# Patient Record
Sex: Female | Born: 1950
Health system: Southern US, Community
[De-identification: ages and names within clinical notes are randomized; demographics above are authoritative.]

## PROBLEM LIST (undated history)

## (undated) DIAGNOSIS — J45909 Unspecified asthma, uncomplicated: Secondary | ICD-10-CM

## (undated) DIAGNOSIS — K859 Acute pancreatitis without necrosis or infection, unspecified: Secondary | ICD-10-CM

## (undated) DIAGNOSIS — S32000A Wedge compression fracture of unspecified lumbar vertebra, initial encounter for closed fracture: Secondary | ICD-10-CM

## (undated) DIAGNOSIS — E7800 Pure hypercholesterolemia, unspecified: Secondary | ICD-10-CM

## (undated) DIAGNOSIS — K219 Gastro-esophageal reflux disease without esophagitis: Secondary | ICD-10-CM

## (undated) DIAGNOSIS — Z9889 Other specified postprocedural states: Secondary | ICD-10-CM

## (undated) DIAGNOSIS — I10 Essential (primary) hypertension: Secondary | ICD-10-CM

## (undated) DIAGNOSIS — M810 Age-related osteoporosis without current pathological fracture: Secondary | ICD-10-CM

## (undated) DIAGNOSIS — E119 Type 2 diabetes mellitus without complications: Secondary | ICD-10-CM

## (undated) DIAGNOSIS — K635 Polyp of colon: Secondary | ICD-10-CM

## (undated) DIAGNOSIS — I719 Aortic aneurysm of unspecified site, without rupture: Secondary | ICD-10-CM

## (undated) HISTORY — DX: Aortic aneurysm of unspecified site, without rupture: I71.9

## (undated) HISTORY — DX: Gastro-esophageal reflux disease without esophagitis: K21.9

## (undated) HISTORY — DX: Essential (primary) hypertension: I10

## (undated) HISTORY — DX: Pure hypercholesterolemia, unspecified: E78.00

## (undated) HISTORY — DX: Unspecified asthma, uncomplicated: J45.909

## (undated) HISTORY — DX: Type 2 diabetes mellitus without complications: E11.9

## (undated) HISTORY — DX: Age-related osteoporosis without current pathological fracture: M81.0

## (undated) HISTORY — DX: Wedge compression fracture of unspecified lumbar vertebra, initial encounter for closed fracture: S32.000A

## (undated) HISTORY — DX: Polyp of colon: K63.5

## (undated) HISTORY — DX: Acute pancreatitis without necrosis or infection, unspecified: K85.90

---

## 1975-04-06 HISTORY — PX: TUBAL LIGATION: SHX77

## 2002-04-05 HISTORY — PX: BACK SURGERY: SHX140

## 2004-04-27 ENCOUNTER — Ambulatory Visit (HOSPITAL_COMMUNITY): Admission: RE | Admit: 2004-04-27 | Discharge: 2004-04-28 | Payer: Self-pay | Admitting: Neurological Surgery

## 2004-11-22 ENCOUNTER — Encounter: Admission: RE | Admit: 2004-11-22 | Discharge: 2004-11-22 | Payer: Self-pay | Admitting: Neurological Surgery

## 2004-12-25 ENCOUNTER — Ambulatory Visit (HOSPITAL_COMMUNITY): Admission: AD | Admit: 2004-12-25 | Discharge: 2004-12-28 | Payer: Self-pay | Admitting: Neurological Surgery

## 2006-12-28 ENCOUNTER — Observation Stay (HOSPITAL_COMMUNITY): Admission: EM | Admit: 2006-12-28 | Discharge: 2006-12-29 | Payer: Self-pay | Admitting: Emergency Medicine

## 2006-12-28 ENCOUNTER — Encounter (INDEPENDENT_AMBULATORY_CARE_PROVIDER_SITE_OTHER): Payer: Self-pay | Admitting: Emergency Medicine

## 2006-12-28 ENCOUNTER — Ambulatory Visit: Payer: Self-pay | Admitting: Family Medicine

## 2009-01-14 IMAGING — CR DG CHEST 1V PORT
1 series · 1 of 1 positions shown · non-contrast
Comparison: 04/27/2004

CLINICAL DATA: Chest pain.  
 PORTABLE CHEST - 12/27/2006:

[view not recorded]
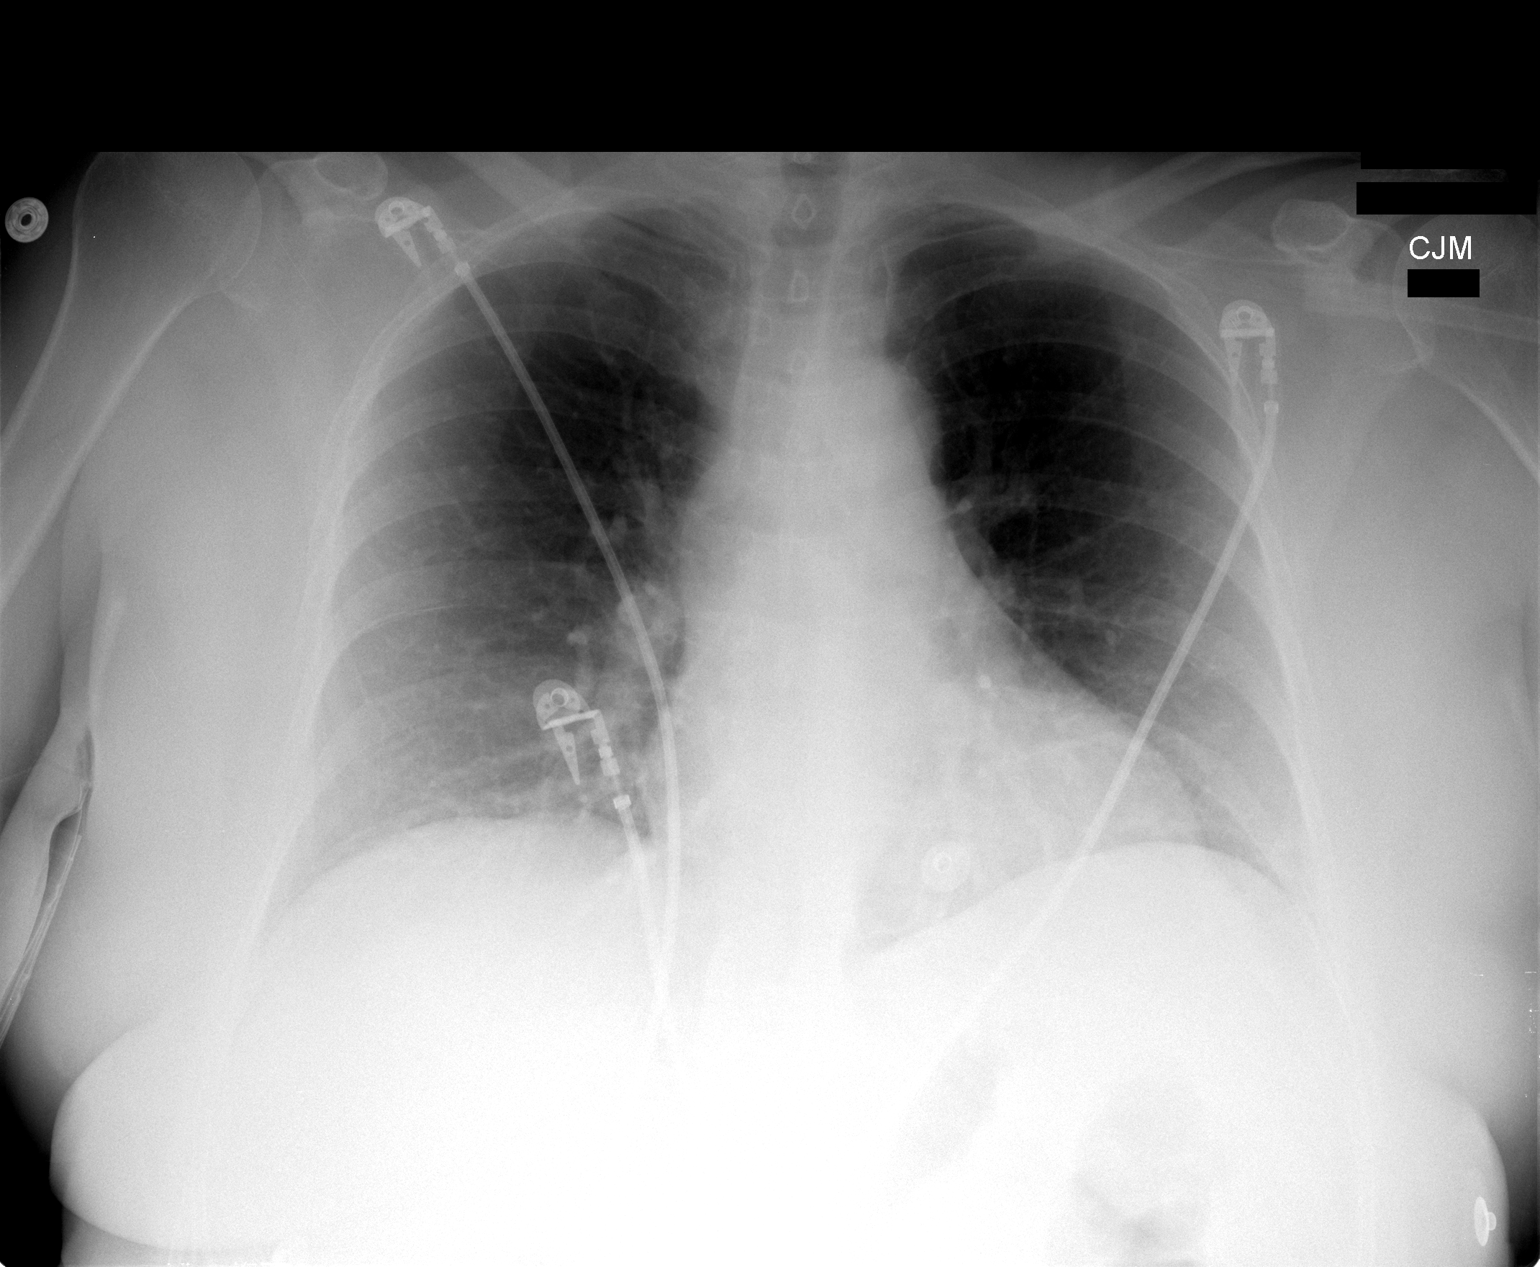

[1 of 1 positions shown; findings below may reference images not displayed]

FINDINGS: Lung volumes are low but the lungs are clear.  Heart size is normal.  No effusion or focal bony abnormality.
IMPRESSION: No acute disease.

## 2010-08-18 NOTE — H&P (Signed)
[name], [name] NO.:  [id number]   MEDICAL RECORD NO.:  [id number]          PATIENT TYPE:  INP   LOCATION:  3737                         FACILITY:  [facility]   PHYSICIAN:  Nicole Price, M.D.DATE OF BIRTH:  1950-06-25   DATE OF ADMISSION:  12/27/2006  DATE OF DISCHARGE:                              HISTORY & PHYSICAL   CHIEF COMPLAINT:  Chest pain, rule out myocardial infarction.   HISTORY OF PRESENT ILLNESS:  This is a 60 year old female with no  significant past medical history, who presents to the ED via EMS, with a  complaint of chest pain.  The patient states that the chest pain started  at approximately 7:45 p.m. on the evening of September 23, while  watching her grandson play baseball.  She said that the pain was  sharp, and points to the sternal area, and states that it radiated  down and around the left chest wall.  The patient reports that the pain  is episodic, stating that it lasted about 1 minute, with spontaneous  resolution, then would return after another minute or so.  This  continued until the patient received aspirin and nitroglycerin per EMS,  with resolution of her symptoms.  The patient reports that the symptoms  have not returned when we saw her in the ED.  The patient also reports  left arm heaviness and numbness after the chest pain started earlier in  the evening.  She denies any pain the arm, however.  Also, denies any  pain radiating to the jaw, denies any diaphoresis, denies nausea and  vomiting as well.  The patient denies any recent chest wall trauma, new  physical activity, or any increased stress in her life at this time.  The patient denies any episodes of chest pain prior to this present  episode, denies shortness of breath.  The patient is a nonsmoker.  The  patient's father died of an early myocardial infarction at approximately  age 45.   The patient's primary care Kelicia Youtz is Dr. Maisie Fus in [place] with [facility]  [address] [facility].   ALLERGIES:  No known drug allergies.   MEDICATIONS:  None.  The patient takes no vitamins or supplements.   PAST MEDICAL HISTORY:  The patient has had 2 back surgeries for  herniated disk, this was in 2004 with repeat procedure in 2006.   SOCIAL HISTORY:  The patient lives with husband.  The patient works in  Conservator, museum/gallery, Administrator, Civil Service.  The patient has 2 children,  ages 31 and 68, both songs.  The patient denies any alcohol, smoking or  drugs.  The patient states that her health care decision-maker is her  husband, if the patient becomes unable to speak for herself.  At this  time, the patient states that she does not want to be intubated, but is  otherwise a full code.   FAMILY HISTORY:  The patient's father died of myocardial infarction at  age 67 with a possible prior myocardial infarction prior to this.  The  patient also reports that her father had lung cancer with surgery  to  remove the cancer at some point as well.  The patient also reports  hypertension in her father.  The patient also reports ruptured abdominal  aortic aneurysm in her mother.  The patient's mother is still living,  however.   REVIEW OF SYSTEMS:  Negative except as per HPI.   PHYSICAL EXAMINATION:  VITAL SIGNS:  Temperature 96.4, heart rate 72,  respiratory rate 20, blood pressure 140/91.  GENERAL:  No acute distress, alert, pleasant.  HEENT:  Extraocular movements are intact.  NECK:  No carotid bruits are auscultated.  CARDIOVASCULAR:  Regular rate and rhythm.  No murmurs, gallops, or rubs  on auscultation.  Normal S1 and S2.  LUNGS:  Clear to auscultation bilaterally.  Normal work of breathing.  No wheezing, rales, or rhonchi auscultated.  ABDOMEN:  Positive bowel sounds, soft, nontender, nondistended.  No  rebound or guarding to palpation.  EXTREMITIES:  There are 2+ peripheral pulses, both dorsalis pedis and  radial.  No lower extremity edema, erythema, or  tenderness to palpation.   WORKUP:  EKG shows normal sinus rhythm, no ST elevation or depression,  or T-wave inversion.  Sodium 142, potassium 4.2, chloride 110, CO2 25,  BUN 12, creatinine 0.9, glucose 92.  White blood cell count 10.2,  hemoglobin 14.3, platelets 230.  Cardiac enzymes done as point of care  testing in the ED showed a troponin of less than 0.05, CK-MB 7.4, and  myoglobin 97.  D-dimer performed in the ER was less than 0.22.   ASSESSMENT AND PLAN:  This is a 60 year old female with no significant  past medical history who presents with chest pain starting earlier in  the evening at approximately 7:30 p.m.  The patient has a positive  family history of early MI in her father.   1. Chest pain:  We will cycle cardiac enzymes for 2 more sets, the      next set being drawn 6 hours after the last set in the ED.  This      will be 6 a.m.  Will also get repeat EKG in the morning.  EKG in      the evening was reassuring.  The patient's chest pain is      substernal, and relieved presumably by nitroglycerin via EMS,      however chest pain is not exertional, and described as sharp.  We      will admit the patient to a telemetry bed, we will also check TSH      and fasting lipid panel in the morning as well.  The patient should      undergo outpatient stress testing.  We will also defer      echocardiogram for outpatient followup as well.  2. Disposition:  Will observe the patient on telemetry.  The patient      is otherwise healthy.  We will monitor her blood pressure while she      is in-house as it was noted to be mildly elevated at 140/91.      Otherwise, she is on no medications.  We will continue to follow      the patient and treat accordingly.      Myrtie Soman, MD  Electronically Signed      Nicole Price, M.D.  Electronically Signed    TE/MEDQ  D:  12/28/2006  T:  12/28/2006  Job:  [id number]

## 2010-08-18 NOTE — Discharge Summary (Signed)
NAMEGEMA, RINGOLD NO.:  [id number]   MEDICAL RECORD NO.:  [id number]          PATIENT TYPE:  INP   LOCATION:  3737                         FACILITY:  [facility]   PHYSICIAN:  Pearlean Brownie, M.D.DATE OF BIRTH:  [birth year]   DATE OF ADMISSION:  12/28/2006  DATE OF DISCHARGE:  12/29/2006                               DISCHARGE SUMMARY   REASON FOR HOSPITALIZATION:  Chest pain, rule out myocardial infarction.   DISCHARGE DIAGNOSIS:  Noncardiac chest pain.   CONSULTATIONS:  [facility]   PROCEDURES:  1. Cardiac catheterization on December 28, 2006.  2. 2-D cardiac echo.   HOSPITAL COURSE:  Ms. Dicenso is a 60 year old who came in with a  complaint of chest pain.  Patient reported that this chest pain started  in her left substernal chest and radiated inferiorly and laterally  around the chest wall.  She also complained of left arm numbness and  heaviness with the onset of chest pain.  Initially, the patient reported  that the chest pain was episodic lasting one minute and was sharp in  nature; pain would spontaneously resolve after a minute and then after  another minute would resume again.  En route to the emergency  department, patient received nitroglycerin and aspirin via EMS with  resolution of her chest pain.  Family practice teaching service was  called to admit the patient for chest pain, rule out myocardial  infarction given the patient's description of her pain, and a strong  family history of early death by myocardial infarction in her father at  age 41.  On admission to the emergency department, patient had one set  of cardiac enzymes drawn that were negative.  A second set of cardiac  enzymes were drawn at approximately six hours after the first set with a  noted rise in her troponins of 0.12 and a CK-MB of 8.7, creatine kinase  was 194.  Third set of cardiac enzymes were drawn six hours after the  second set with decreased in  troponins to 0.07, CK-MB of 6.4 and a  creatinine kinase of 163.  After the second set of enzymes were drawn,  cardiology was consulted given the patient's bump in troponins and lack  of any other good explanation for this.  Patient had no decreased renal  function and presumably had no signs of heart failure as well.  Accordingly, patient was admitted to the hospital and setup for cardiac  catheterization via cardiology.  Cardiac catheterization was performed  on the afternoon of September 24 and showed no evidence of stenosis with  an ejection fraction of 65% and was in general cleared for any cardiac  vessel disease.  On second day of hospitalization, September 25, after  the catheterization, patient was doing well.  She had not had any chest  pain since admission to the hospital with resolution occurring on the  way to the emergency department via EMS.  Patient was ambulating without  difficulty, tolerating good oral intake and, again, had no return of her  symptoms.  Vital signs were stable throughout her admission with blood  pressure  ranging from 106 to 140 systolic/62 to 91 diastolic.  Physical  exam was within normal limits throughout her hospitalization.  Patient  had two electrocardiograms performed on this admission and both showed  normal sinus rhythm with no ST or T wave changes.  On September 25,  given negative cardiac catheterization and a normal 2-D echo that showed  an ejection fraction of 60% with no other serious abnormalities, patient  was discharged home in good condition.  It should be noted during this  hospitalization that a fasting lipid panel was drawn showing a total  cholesterol of 201, an LDL of 141, HDL of 43 and triglycerides of 83.  Accordingly, the patient was discharged home on Zocor to treat this  elevated cholesterol level.   SIGNIFICANT FINDINGS:  1. Cardiac catheterization:  No evidence of significant stenosis;      ejection fraction of 65%.  2. A  2-D echo:  Ejection fraction of 60% with no concerning      abnormalities.  3. Admission CBC showed a white blood cell count of 9.1, hemoglobin of      14.0 and platelet count of 209.  Sodium 138, potassium 3.8,      chloride 110, bicarb of 24, BUN of 10, creatinine of 0.83, calcium      9.0.  4. First set of cardiac enzymes were negative.  Second set of cardiac      enzymes showed a troponin of 0.12 with a CK-MB of 8.7 and a CK of      194.  Third set of cardiac enzymes showed a decrease in troponins      to 0.07, a CK-MB of 6.4.  5. Fasting lipid panel showed total cholesterol 201, triglycerides 83,      HDL 43 and LDL 141.  6. TSH drawn on this admission was within normal limits.   DISCHARGE MEDICATIONS:  1. Zocor 40 mg p.o. daily.  2. Aspirin 81 mg p.o. daily.   DISCHARGE INSTRUCTIONS:  1. Patient is to take medication mentioned previously.  2. Patient is to follow up with Dr. Maisie Fus on October 6 at 11:00 a.m.  3. Patient is to resume normal activities at home.  4. Patient should return if she has any return of her chest pain,      shortness of breath, or any other concerning symptoms.   PENDING ISSUES TO BE FOLLOWED UP:  None.      Myrtie Soman, MD  Electronically Signed      Pearlean Brownie, M.D.  Electronically Signed    TE/MEDQ  D:  12/29/2006  T:  12/29/2006  Job:  [id number]   cc:   Dr. Maisie Fus

## 2010-08-18 NOTE — Cardiovascular Report (Signed)
Nicole Price, Nicole Price NO.:  [id number]   MEDICAL RECORD NO.:  [id number]          PATIENT TYPE:  INP   LOCATION:  3737                         FACILITY:  [facility]   PHYSICIAN:  Ritta Slot, MD     DATE OF BIRTH:  1950/08/22   DATE OF PROCEDURE:  12/28/2006  DATE OF DISCHARGE:                            CARDIAC CATHETERIZATION   PROCEDURE PERFORMED:  1. Selective angiogram performed by Judkins' technique.  2. Retrograde left heart catheterization.  3. Left ventricular angiography.   COMPLICATIONS:  None.   ENTRY SITE:  Right femoral.   Patient profile:  This is a 60 year old lady who was admitted to [facility] last  night with an episode of substernal chest pain radiating up and down her  chest. It was sudden onset, relieved with nothing.  She came to [facility] ER where she had recurrence of her chest pain that was relieved  with nitroglycerin.  She has no known risk factors for coronary artery  disease but her troponin had bumped to 0.12 and is now trending down.  She is otherwise fit and well and has had no past medical history.  She  subsequently was brought to the cardiac cath lab for further delineation  of her coronary anatomy.   RESULTS:  The left ventricular pressure was found to be 112-3-8, the  pull back aortic pressure was 112-66-87.  There was no significant  gradient by pull back.  Angiographic results:  1)  Left main coronary  artery appears normal.  2)  The left anterior descending artery appears  normal.  3)  The left circumflex artery appears normal.  4)  The right  coronary artery is dominant supplying the PDA and appears normal.  The  left ventricular function was normal with an ejection fraction of 65%.   FINAL DIAGNOSIS:  Normal coronary arteries.  Normal left ventricular  function.  Non cardiac chest pain with a slight bump in her troponins of  unclear etiology.  The patient has returned back to her bed in a stable  condition.  We will continue.  Many thanks.      Ritta Slot, MD  Electronically Signed     HS/MEDQ  D:  12/28/2006  T:  12/28/2006  Job:  [id number]

## 2010-08-21 NOTE — Discharge Summary (Signed)
NAMEDAVIN, MURAMOTO NO.:  [id number]   MEDICAL RECORD NO.:  [id number]          PATIENT TYPE:  OIB   LOCATION:  3017                         FACILITY:  [facility]   PHYSICIAN:  Tia Alert, MD     DATE OF BIRTH:  October 23, 1950   DATE OF ADMISSION:  12/25/2004  DATE OF DISCHARGE:  12/28/2004                                 DISCHARGE SUMMARY   DIAGNOSIS:  Recurrent lumbar disk herniation L5-S1 on the right.   PROCEDURE:  Lumbar re-exploration with redo diskectomy L5-S1 on the right.   BRIEF HISTORY OF PRESENT ILLNESS:  Ms. Kirkendoll is a 60 year old white  female who presented to the office with recurrent right leg pain in an S1  distribution. She had had a previous lumbar laminectomy with diskectomy at  L5-S1 on the right about six months earlier. She had an MRI which showed a  large recurrent disk herniation at L5-S1 on the right. I recommended a redo  microdiskectomy. She understood the risks, benefits, and expected outcome  and wished to proceed.   HOSPITAL COURSE:  The patient was admitted on 12/25/2004 and taken to  operating room where she underwent a redo diskectomy at L5-S1 on the right.  She had an unintended durotomy at the time of surgery. This was repaired  primarily. She had tolerated the procedure well and was taken to the  recovery room and then to the floor in stable condition. She remained at  flat bedrest for 48 hours postoperatively to try to allow healing of the  repair site at the unintended durotomy. She had minimal leg pain behind her  right knee. She had no headache. She was tolerating a regular diet and she  remained afebrile. On 12/28/2004, she was upright, she was ambulating  without difficulty, she had no evidence of headache. She complained of very  mild residual right leg pain, likely from nerve root retraction during the  surgery. Her incision was clean, dry, and intact. She had good dorsi and  plantar flexion strength. She was eating  well. She was discharged home in  stable condition on this day with plans to follow up in 2 weeks. She was  asked to call for any unusual redness, tenderness, swelling, or drainage  from her wound, or any increasing positional headache. She is given a  prescription for Vicodin and Medrol Dosepak upon discharge.  Her follow-up  appointment was in 2 weeks.      Tia Alert, MD  Electronically Signed     DSJ/MEDQ  D:  12/28/2004  T:  12/28/2004  Job:  [id number]

## 2010-08-21 NOTE — Op Note (Signed)
Nicole, Price NO.:  [id number]   MEDICAL RECORD NO.:  [id number]          PATIENT TYPE:  OIB   LOCATION:  2899                         FACILITY:  [facility]   PHYSICIAN:  Tia Alert, MD     DATE OF BIRTH:  February 18, 1951   DATE OF PROCEDURE:  04/27/2004  DATE OF DISCHARGE:                                 OPERATIVE REPORT   PREOPERATIVE DIAGNOSIS:  Herniated disk, L5-S1 on the right with right S1  radiculopathy.   POSTOPERATIVE DIAGNOSIS:  Herniated disk, L5-S1 on the right with right S1  radiculopathy.   PROCEDURE:  Lumbar hemilaminectomy, medial facetectomy, and hemilaminectomy  with microdiskectomy, L5-S1 on the right, utilizing microscopic dissection.   SURGEON:  Tia Alert, M.D.   ASSISTANT:  Kathaleen Maser. Pool, M.D.   ANESTHESIA:  General endotracheal.   COMPLICATIONS:  None apparent.   INDICATIONS FOR PROCEDURE:  Ms. Mell is a 60 year old white female who  was referred with severe back and right leg pain in an S1 distribution.  She  had an MRI which showed a herniated disk at L5-S1 on the right.  She was in  severe pain and was failing medical management.  I recommended a  microdiskectomy at L5-S1 on the right.  She understood the risks, benefits  and expected outcome and wished to proceed.   DESCRIPTION OF PROCEDURE:  Patient was taken to the operating room.  After  induction of generalized endotracheal anesthesia, she was rolled in the  prone position on the Wilson frame.  All pressure points were padded.  The  lumbar region was prepped with Duraprep and then draped in the usual sterile  fashion.  Then 5 cc of local anesthesia was injected, and then a dorsal  midline incision was made and carried down to the lumbosacral fascia.  The  fascia was opened on the patient's right side.  The paraspinous musculature  was taken down in a subperiosteal fashion to expose the L5-S1 nerve space on  the right.  Intraoperative x-ray confirmed the level,  and the Kerrison punch  was used to perform a hemilaminectomy, medial facetectomy, and foraminotomy  of L5-S1 on the right side.  The yellow ligament was identified, opened, and  removed to expose the underlying dura and S1 nerve root.  The S1 nerve root  was retracted medially, and a large free fragment was removed adjacent to  the pedicle.  The operating microscope was brought into the field, and then  several more fragments were removed with a nerve hook.  The hole in the  annulus was obvious.  We performed an intradiskal diskectomy through the  hole in the annulus.  We then used a coronary dilator to palpate and make  sure there were no more free fragments or compressive lesions.  The nerve  root was freed.  It was irrigated with copious amounts of bacitracin-  containing saline solution, dried all bleeding points, and then closed the  fascia with an interrupted #1 Vicryl placed.  The subcutaneous and  subcuticular tissues  were 2-0 and 3-0 Vicryl and closed with skin with Dermabond.  The drapes  were removed.  The patient was awakened from general anesthesia and  transported to the recovery room in stable condition.  At the end of the  procedure, all sponge, needle, and instrument counts were correct.      DSJ/MEDQ  D:  04/27/2004  T:  04/27/2004  Job:  [id number]

## 2010-08-21 NOTE — Op Note (Signed)
Nicole Price, Nicole Price NO.:  [id number]   MEDICAL RECORD NO.:  [id number]          PATIENT TYPE:  OIB   LOCATION:  3017                         FACILITY:  [facility]   PHYSICIAN:  Tia Alert, MD     DATE OF BIRTH:  1951-02-14   DATE OF PROCEDURE:  12/25/2004  DATE OF DISCHARGE:                                 OPERATIVE REPORT   PREOPERATIVE DIAGNOSIS:  Recurrent lumbar disk herniation L5-S1, on the  right.   POSTOPERATIVE DIAGNOSES:  Recurrent lumbar disk herniation L5-S1, on the  right.   PROCEDURE:  Redo hemilaminectomy, medial facetectomy and foraminotomy L5-S1  on the right followed by microdiskectomy L5-S1 on the right microscopic  dissection.   SURGEON:  Marikay Alar, M.D.   ASSISTANT:  Reinaldo Meeker, M.D.   ANESTHESIA:  General tracheal.   COMPLICATIONS:  Unintended durotomy L5-S1 on the right with primary dural  repair.   HISTORY OF PRESENT ILLNESS:  Nicole Price is a 54-year white female who  underwent a microdiskectomy about six months ago and did quite well until  last the few weeks when she had a recurrent episode of right leg pain.  She  had an MRI which showed a large recurrent disk herniation L5-S1 right side.  I recommended a microdiskectomy at L5-S on the right.  She understood the  risks, the benefits and expected outcome and wished to proceed.   DESCRIPTION OF PROCEDURE:  The patient was taken to operating room and after  induction of adequate generalized endotracheal anesthesia, she was rolled  into the prone position on the Wilson frame, and all pressure points were  padded.  Her lumbar region was prepped with DuraPrep and draped in the usual  sterile fashion; 5 cc of local anesthesia was injected and a dorsal midline  incision and carried down through the lumbosacral fascia to expose L5-S1  interspace on the right side.  Intraoperative x-ray confirmed my level, and  I then began widening the previous hemilaminectomy, medial  facetectomy and  foraminotomy.  While widening the lateral edge of the facet, I had a partial-  thickness tear in the dura which during repair seemed to extend.  The total  length was probably 5-7 mm in length.  I placed several 6-0 Prolene sutures  in this to repair this.  Once the repair was done I continued my dissection  down along the medial facet to expose the disk space.  I was then able to  widen the foraminotomy, identify the pedicle and incised the disk space and  then perform a thorough intradiskal diskectomy.  We found several large  pieces mostly in the midline that were removed.  Once the defect diskectomy  was complete, we palpated with a nerve hook into the midline and under the  nerve root to make sure that we had adequate decompression of the nerve  root. We then inspected our dural repair once again and placed one more  single 6-0 Prolene suture where there seemed to be a small trickle of CSF  where there seemed to be a small trickle of CSF.  We then performed two  Valsalva maneuvers with no evidence of CSF leak from the repair site.  I  then cut a piece of muscle from the subfascial region.  I wrapped this in  Surgicel and placed this over the unintended durotomy and then sealed this  with Tisseel fibrin glue.  The wound was irrigated with copious amounts of  Bacitracin containing saline solution just prior to this.  I then layered  all of this with Gelfoam and then removed the retractor and closed the  fascia with interrupted #1 Vicryl, closing the subcutaneous and subcuticular  tissue with 2-0 and 3-0 Vicryl and  closed skin with Benzoin and Steri-Strips.  The drapes were removed. A  sterile dressing was applied.  The patient was awakened from general  anesthesia and transported to the recovery room in stable condition.  At the  end of the procedure all sponge, needle and sponge counts were correct.      Tia Alert, MD  Electronically Signed     DSJ/MEDQ   D:  12/25/2004  T:  12/26/2004  Job:  [id number]

## 2010-09-17 ENCOUNTER — Emergency Department (HOSPITAL_COMMUNITY): Payer: BC Managed Care – PPO

## 2010-09-17 ENCOUNTER — Emergency Department (HOSPITAL_COMMUNITY)
Admission: EM | Admit: 2010-09-17 | Discharge: 2010-09-17 | Disposition: A | Discharge status: Home / Self Care | Payer: BC Managed Care – PPO | Attending: Emergency Medicine | Admitting: Emergency Medicine

## 2010-09-17 DIAGNOSIS — R52 Pain, unspecified: Secondary | ICD-10-CM | POA: Insufficient documentation

## 2010-09-17 DIAGNOSIS — R112 Nausea with vomiting, unspecified: Secondary | ICD-10-CM | POA: Insufficient documentation

## 2010-09-17 DIAGNOSIS — Z7982 Long term (current) use of aspirin: Secondary | ICD-10-CM | POA: Insufficient documentation

## 2010-09-17 DIAGNOSIS — Z9889 Other specified postprocedural states: Secondary | ICD-10-CM | POA: Insufficient documentation

## 2010-09-17 DIAGNOSIS — R209 Unspecified disturbances of skin sensation: Secondary | ICD-10-CM | POA: Insufficient documentation

## 2010-09-17 DIAGNOSIS — R071 Chest pain on breathing: Secondary | ICD-10-CM | POA: Insufficient documentation

## 2010-09-17 DIAGNOSIS — M79609 Pain in unspecified limb: Secondary | ICD-10-CM | POA: Insufficient documentation

## 2010-09-17 DIAGNOSIS — E789 Disorder of lipoprotein metabolism, unspecified: Secondary | ICD-10-CM | POA: Insufficient documentation

## 2010-09-17 DIAGNOSIS — Z79899 Other long term (current) drug therapy: Secondary | ICD-10-CM | POA: Insufficient documentation

## 2010-09-17 LAB — CK TOTAL AND CKMB (NOT AT ARMC)
CK, MB: 4.5 ng/mL — ABNORMAL HIGH (ref 0.3–4.0)
Relative Index: 2.3 (ref 0.0–2.5)

## 2010-09-17 LAB — TROPONIN I: Troponin I: <0.3 ng/mL (ref <0.30)

## 2010-09-17 LAB — COMPREHENSIVE METABOLIC PANEL
ALT: 36 U/L — ABNORMAL HIGH (ref 0–35)
AST: 32 U/L (ref 0–37)
Albumin: 4.4 g/dL (ref 3.5–5.2)
CO2: 19 mEq/L (ref 19–32)
Calcium: 9.9 mg/dL (ref 8.4–10.5)
Creatinine, Ser: 0.65 mg/dL (ref 0.4–1.2)
GFR calc non Af Amer: >60 mL/min (ref >60)
Sodium: 140 mEq/L (ref 135–145)
Total Protein: 7.4 g/dL (ref 6.0–8.3)

## 2010-09-17 LAB — CBC
MCH: 31.4 pg (ref 26.0–34.0)
MCHC: 35.4 g/dL (ref 30.0–36.0)
MCV: 88.7 fL (ref 78.0–100.0)
Platelets: 223 10*3/uL (ref 150–400)
RBC: 5.32 MIL/uL — ABNORMAL HIGH (ref 3.87–5.11)
RDW: 13 % (ref 11.5–15.5)

## 2010-09-29 NOTE — Consult Note (Signed)
NAMEPHOENIX, RIESEN NO.:  [id number]  MEDICAL RECORD NO.:  [id number]  LOCATION:  [facility]                         FACILITY:  [facility]  PHYSICIAN:  Pamella Pert, MD DATE OF BIRTH:  [birth year]  DATE OF CONSULTATION:  09/17/2010 DATE OF DISCHARGE:  09/17/2010                                CONSULTATION   REQUESTING PHYSICIAN:  ED consultation.  REASON FOR CONSULTATION:  Chest pain.  HISTORY:  Nicole Price is a pleasant 60 year old female who has no significant known coronary artery disease.  She had been doing well until Saturday.  She had developed chest heaviness with radiation to her left arm.  She took four aspirin tablets and felt better.  She did well since then.  This happened about 4-5 days ago last Saturday.  Over this morning when she woke up, she was very nauseous.  She also developed severe chest pain with radiation to her left arm.  With severe nausea, vomiting, and severe chest pain, she eventually presented to [facility] emergency department this late afternoon.  She states that now she is feeling well.  Her worse chest pain was around 9 o'clock in the morning.  She has still mild chest discomfort, but she has a bag in front of her face saying she is extremely nauseous. No associated shortness of breath, palpitations, syncope.  No hemoptysis.  REVIEW OF SYSTEMS:  She denies any bowel or bladder disturbances.  No melena.  No recent weight changes.  She is not a diabetic.  Other systems are negative.  ALLERGIES:  No known drug allergies.  PAST MEDICAL HISTORY:  Significant for hyperlipidemia.  HOME MEDICATIONS: 1. Aspirin 81 mg p.o. daily. 2. Flaxseed capsule once per day. 3. Zocor, dose not known at 1 p.m. 4. Vitamin D3 1 p.o. daily.  FAMILY HISTORY:  There is no history of premature coronary artery disease in family.  SOCIAL HISTORY:  She is married, lives with her husband.  She does not use any tobacco products.  Does not  drink alcohol.  PHYSICAL EXAMINATION:  GENERAL:  She is moderately built and overweight. She appears to be in no acute distress, although she obviously has nausea.  Has a bag in front of her face. VITAL SIGNS:  Temperature of 98.1, pulse of 78 beats per minute and regular, respirations 16, blood pressure 134/81 mmHg. CARDIAC:  Distant heart sounds.  S1 and S1 are normal without any gallops or murmur. CHEST:  Bilateral equal breath sounds without any rhonchi of crackles. ABDOMEN:  Soft with mild epigastric tenderness present.  No guarding or rigidity.  Bowel sounds heard in all 4 quadrants. EXTREMITIES:  Full range of movements without any edema or tenderness.  Her EKG demonstrates normal sinus rhythm, left axis deviation, left ventricular fascicular block.  Poor R-wave progression and COPD pattern. Compared to 2008, COPD pattern is new.  Her CBC reveals a hemoglobin of 16.7, hematocrit of 47.2, white count of 14.3, platelet count of 223,000.  Other labs include CPKs of 192, MB of 4.54, relative index was 2.3.  Her troponin was negative for myocardial injury with less than 0.30.  Her CMP revealed electrolytes to be normal.  BUN and  creatinine are normal.  Bilirubin is elevated at 1.3, SGPT is elevated at 36, alkaline phosphatase is normal at 77.  IMPRESSION: 1. Chest pain, although suggestive of angina pectoris, she had similar     presentation in 2008.  At that time, cardiac catheterization had     revealed normal coronary arteries.  She again presents very     similarly.  I do not think this is unstable angina.  I suspect GI     as an etiology for her chest pain. 2. Nausea, vomiting.  I suspect she probably has significant     gastroesophageal reflux disease.  Acute cholecystitis cannot be     completely excluded.  RECOMMENDATIONS:  At this point, I will be happy to follow the patient along with you.  I have discussed my findings and my impressions with emergency department  physicians.  Depending on her further lab workup, either she will be admitted to the hospital for observation or she will be discharged home.  I will be happy to follow her up in the outpatient basis.  However, if she does stay in the hospital, I will be happy to follow her also in the hospital setting.     Pamella Pert, MD     JRG/MEDQ  D:  09/17/2010  T:  09/18/2010  Job:  [id number]  Electronically Signed by Yates Decamp MD on 09/29/2010 10:21:11 AM

## 2011-01-14 LAB — COMPREHENSIVE METABOLIC PANEL
ALT: 35
BUN: 10
CO2: 24
Calcium: 9.1
Creatinine, Ser: 0.72
GFR calc non Af Amer: >60
Glucose, Bld: 83

## 2011-01-14 LAB — POCT CARDIAC MARKERS
CKMB, poc: 7.4
Myoglobin, poc: 115
Myoglobin, poc: 97
Operator id: 270111
Operator id: 277751

## 2011-01-14 LAB — CBC
HCT: 41
Hemoglobin: 14
Hemoglobin: 14.3
MCHC: 34.2
MCV: 89.2
Platelets: 209
Platelets: 230
RBC: 4.6
RDW: 13.4
RDW: 13.6
WBC: 10.2
WBC: 9.1

## 2011-01-14 LAB — CARDIAC PANEL(CRET KIN+CKTOT+MB+TROPI)
CK, MB: 6.4 — ABNORMAL HIGH
CK, MB: 8.7 — ABNORMAL HIGH
Relative Index: 3.9 — ABNORMAL HIGH
Total CK: 163
Total CK: 194 — ABNORMAL HIGH
Troponin I: 0.07 — ABNORMAL HIGH
Troponin I: 0.12 — ABNORMAL HIGH

## 2011-01-14 LAB — BASIC METABOLIC PANEL
BUN: 10
CO2: 24
Calcium: 9
Chloride: 110
Creatinine, Ser: 0.83
GFR calc Af Amer: >60
GFR calc non Af Amer: >60
Glucose, Bld: 86
Potassium: 3.8
Sodium: 138

## 2011-01-14 LAB — I-STAT 8, (EC8 V) (CONVERTED LAB)
Acid-base deficit: 1
Chloride: 110
Glucose, Bld: 92
Hemoglobin: 15.3 — ABNORMAL HIGH
Potassium: 4.2
Sodium: 142
TCO2: 25
pH, Ven: 7.379 — ABNORMAL HIGH

## 2011-01-14 LAB — LIPID PANEL
HDL: 43
Total CHOL/HDL Ratio: 4.7
VLDL: 17

## 2011-01-14 LAB — POCT I-STAT CREATININE: Operator id: 270111

## 2014-04-05 HISTORY — PX: PARTIAL HYSTERECTOMY: SHX80

## 2014-04-16 DIAGNOSIS — E559 Vitamin D deficiency, unspecified: Secondary | ICD-10-CM | POA: Diagnosis not present

## 2014-04-16 DIAGNOSIS — R7989 Other specified abnormal findings of blood chemistry: Secondary | ICD-10-CM | POA: Diagnosis not present

## 2014-04-16 DIAGNOSIS — E060 Acute thyroiditis: Secondary | ICD-10-CM | POA: Diagnosis not present

## 2014-04-16 DIAGNOSIS — N8111 Cystocele, midline: Secondary | ICD-10-CM | POA: Diagnosis not present

## 2014-04-16 DIAGNOSIS — N182 Chronic kidney disease, stage 2 (mild): Secondary | ICD-10-CM | POA: Diagnosis not present

## 2014-04-16 DIAGNOSIS — I1310 Hypertensive heart and chronic kidney disease without heart failure, with stage 1 through stage 4 chronic kidney disease, or unspecified chronic kidney disease: Secondary | ICD-10-CM | POA: Diagnosis not present

## 2014-04-16 DIAGNOSIS — E785 Hyperlipidemia, unspecified: Secondary | ICD-10-CM | POA: Diagnosis not present

## 2014-04-16 DIAGNOSIS — R05 Cough: Secondary | ICD-10-CM | POA: Diagnosis not present

## 2014-04-16 DIAGNOSIS — R8290 Unspecified abnormal findings in urine: Secondary | ICD-10-CM | POA: Diagnosis not present

## 2014-04-16 DIAGNOSIS — N814 Uterovaginal prolapse, unspecified: Secondary | ICD-10-CM | POA: Diagnosis not present

## 2014-04-16 DIAGNOSIS — J4520 Mild intermittent asthma, uncomplicated: Secondary | ICD-10-CM | POA: Diagnosis not present

## 2014-04-16 DIAGNOSIS — Z79899 Other long term (current) drug therapy: Secondary | ICD-10-CM | POA: Diagnosis not present

## 2014-04-16 DIAGNOSIS — Z01818 Encounter for other preprocedural examination: Secondary | ICD-10-CM | POA: Diagnosis not present

## 2014-04-30 DIAGNOSIS — Z79899 Other long term (current) drug therapy: Secondary | ICD-10-CM | POA: Diagnosis not present

## 2014-04-30 DIAGNOSIS — N182 Chronic kidney disease, stage 2 (mild): Secondary | ICD-10-CM | POA: Diagnosis not present

## 2014-04-30 DIAGNOSIS — E785 Hyperlipidemia, unspecified: Secondary | ICD-10-CM | POA: Diagnosis not present

## 2014-04-30 DIAGNOSIS — E0590 Thyrotoxicosis, unspecified without thyrotoxic crisis or storm: Secondary | ICD-10-CM | POA: Diagnosis not present

## 2014-04-30 DIAGNOSIS — N8111 Cystocele, midline: Secondary | ICD-10-CM | POA: Diagnosis not present

## 2014-04-30 DIAGNOSIS — J4520 Mild intermittent asthma, uncomplicated: Secondary | ICD-10-CM | POA: Diagnosis not present

## 2014-04-30 DIAGNOSIS — N816 Rectocele: Secondary | ICD-10-CM | POA: Diagnosis not present

## 2014-04-30 DIAGNOSIS — Z23 Encounter for immunization: Secondary | ICD-10-CM | POA: Diagnosis not present

## 2014-04-30 DIAGNOSIS — Z01818 Encounter for other preprocedural examination: Secondary | ICD-10-CM | POA: Diagnosis not present

## 2014-04-30 DIAGNOSIS — I1310 Hypertensive heart and chronic kidney disease without heart failure, with stage 1 through stage 4 chronic kidney disease, or unspecified chronic kidney disease: Secondary | ICD-10-CM | POA: Diagnosis not present

## 2014-04-30 DIAGNOSIS — N814 Uterovaginal prolapse, unspecified: Secondary | ICD-10-CM | POA: Diagnosis not present

## 2014-04-30 DIAGNOSIS — R7989 Other specified abnormal findings of blood chemistry: Secondary | ICD-10-CM | POA: Diagnosis not present

## 2014-05-27 DIAGNOSIS — R05 Cough: Secondary | ICD-10-CM | POA: Diagnosis not present

## 2014-05-27 DIAGNOSIS — Z79899 Other long term (current) drug therapy: Secondary | ICD-10-CM | POA: Diagnosis not present

## 2014-05-27 DIAGNOSIS — E785 Hyperlipidemia, unspecified: Secondary | ICD-10-CM | POA: Diagnosis not present

## 2014-05-27 DIAGNOSIS — Z0000 Encounter for general adult medical examination without abnormal findings: Secondary | ICD-10-CM | POA: Diagnosis not present

## 2014-05-27 DIAGNOSIS — Z136 Encounter for screening for cardiovascular disorders: Secondary | ICD-10-CM | POA: Diagnosis not present

## 2014-05-27 DIAGNOSIS — E0590 Thyrotoxicosis, unspecified without thyrotoxic crisis or storm: Secondary | ICD-10-CM | POA: Diagnosis not present

## 2014-05-27 DIAGNOSIS — K210 Gastro-esophageal reflux disease with esophagitis: Secondary | ICD-10-CM | POA: Diagnosis not present

## 2014-05-27 DIAGNOSIS — N182 Chronic kidney disease, stage 2 (mild): Secondary | ICD-10-CM | POA: Diagnosis not present

## 2014-05-27 DIAGNOSIS — I1310 Hypertensive heart and chronic kidney disease without heart failure, with stage 1 through stage 4 chronic kidney disease, or unspecified chronic kidney disease: Secondary | ICD-10-CM | POA: Diagnosis not present

## 2014-05-27 DIAGNOSIS — E559 Vitamin D deficiency, unspecified: Secondary | ICD-10-CM | POA: Diagnosis not present

## 2014-05-29 DIAGNOSIS — Z136 Encounter for screening for cardiovascular disorders: Secondary | ICD-10-CM | POA: Diagnosis not present

## 2014-06-19 DIAGNOSIS — N814 Uterovaginal prolapse, unspecified: Secondary | ICD-10-CM | POA: Diagnosis not present

## 2014-06-26 DIAGNOSIS — J45909 Unspecified asthma, uncomplicated: Secondary | ICD-10-CM | POA: Diagnosis not present

## 2014-06-26 DIAGNOSIS — D252 Subserosal leiomyoma of uterus: Secondary | ICD-10-CM | POA: Diagnosis not present

## 2014-06-26 DIAGNOSIS — N72 Inflammatory disease of cervix uteri: Secondary | ICD-10-CM | POA: Diagnosis not present

## 2014-06-26 DIAGNOSIS — D251 Intramural leiomyoma of uterus: Secondary | ICD-10-CM | POA: Diagnosis not present

## 2014-06-26 DIAGNOSIS — R001 Bradycardia, unspecified: Secondary | ICD-10-CM | POA: Diagnosis not present

## 2014-06-26 DIAGNOSIS — M810 Age-related osteoporosis without current pathological fracture: Secondary | ICD-10-CM | POA: Diagnosis not present

## 2014-06-26 DIAGNOSIS — Z79899 Other long term (current) drug therapy: Secondary | ICD-10-CM | POA: Diagnosis not present

## 2014-06-26 DIAGNOSIS — N761 Subacute and chronic vaginitis: Secondary | ICD-10-CM | POA: Diagnosis not present

## 2014-06-26 DIAGNOSIS — Z7982 Long term (current) use of aspirin: Secondary | ICD-10-CM | POA: Diagnosis not present

## 2014-06-26 DIAGNOSIS — E0590 Thyrotoxicosis, unspecified without thyrotoxic crisis or storm: Secondary | ICD-10-CM | POA: Diagnosis not present

## 2014-06-26 DIAGNOSIS — E785 Hyperlipidemia, unspecified: Secondary | ICD-10-CM | POA: Diagnosis not present

## 2014-06-26 DIAGNOSIS — N813 Complete uterovaginal prolapse: Secondary | ICD-10-CM | POA: Diagnosis not present

## 2014-06-26 DIAGNOSIS — K219 Gastro-esophageal reflux disease without esophagitis: Secondary | ICD-10-CM | POA: Diagnosis not present

## 2014-06-26 DIAGNOSIS — I129 Hypertensive chronic kidney disease with stage 1 through stage 4 chronic kidney disease, or unspecified chronic kidney disease: Secondary | ICD-10-CM | POA: Diagnosis not present

## 2014-06-26 DIAGNOSIS — N182 Chronic kidney disease, stage 2 (mild): Secondary | ICD-10-CM | POA: Diagnosis not present

## 2014-06-27 DIAGNOSIS — N814 Uterovaginal prolapse, unspecified: Secondary | ICD-10-CM | POA: Diagnosis not present

## 2014-06-27 DIAGNOSIS — N813 Complete uterovaginal prolapse: Secondary | ICD-10-CM | POA: Diagnosis not present

## 2014-06-27 DIAGNOSIS — N72 Inflammatory disease of cervix uteri: Secondary | ICD-10-CM | POA: Diagnosis not present

## 2014-06-27 DIAGNOSIS — N761 Subacute and chronic vaginitis: Secondary | ICD-10-CM | POA: Diagnosis not present

## 2014-06-27 DIAGNOSIS — D251 Intramural leiomyoma of uterus: Secondary | ICD-10-CM | POA: Diagnosis not present

## 2014-06-27 DIAGNOSIS — I10 Essential (primary) hypertension: Secondary | ICD-10-CM | POA: Diagnosis not present

## 2014-06-27 DIAGNOSIS — D252 Subserosal leiomyoma of uterus: Secondary | ICD-10-CM | POA: Diagnosis not present

## 2014-06-27 DIAGNOSIS — J45909 Unspecified asthma, uncomplicated: Secondary | ICD-10-CM | POA: Diagnosis not present

## 2014-06-27 DIAGNOSIS — E785 Hyperlipidemia, unspecified: Secondary | ICD-10-CM | POA: Diagnosis not present

## 2014-06-28 DIAGNOSIS — N72 Inflammatory disease of cervix uteri: Secondary | ICD-10-CM | POA: Diagnosis not present

## 2014-06-28 DIAGNOSIS — N813 Complete uterovaginal prolapse: Secondary | ICD-10-CM | POA: Diagnosis not present

## 2014-06-28 DIAGNOSIS — D252 Subserosal leiomyoma of uterus: Secondary | ICD-10-CM | POA: Diagnosis not present

## 2014-06-28 DIAGNOSIS — E785 Hyperlipidemia, unspecified: Secondary | ICD-10-CM | POA: Diagnosis not present

## 2014-06-28 DIAGNOSIS — D251 Intramural leiomyoma of uterus: Secondary | ICD-10-CM | POA: Diagnosis not present

## 2014-06-28 DIAGNOSIS — N761 Subacute and chronic vaginitis: Secondary | ICD-10-CM | POA: Diagnosis not present

## 2014-08-08 DIAGNOSIS — H01009 Unspecified blepharitis unspecified eye, unspecified eyelid: Secondary | ICD-10-CM | POA: Diagnosis not present

## 2014-08-12 DIAGNOSIS — R0789 Other chest pain: Secondary | ICD-10-CM | POA: Diagnosis not present

## 2014-08-12 DIAGNOSIS — I249 Acute ischemic heart disease, unspecified: Secondary | ICD-10-CM | POA: Diagnosis not present

## 2014-08-12 DIAGNOSIS — R079 Chest pain, unspecified: Secondary | ICD-10-CM | POA: Diagnosis not present

## 2014-08-12 DIAGNOSIS — I10 Essential (primary) hypertension: Secondary | ICD-10-CM | POA: Diagnosis not present

## 2014-08-12 DIAGNOSIS — E780 Pure hypercholesterolemia: Secondary | ICD-10-CM | POA: Diagnosis not present

## 2014-08-12 DIAGNOSIS — E876 Hypokalemia: Secondary | ICD-10-CM | POA: Diagnosis not present

## 2014-08-12 DIAGNOSIS — K219 Gastro-esophageal reflux disease without esophagitis: Secondary | ICD-10-CM | POA: Diagnosis not present

## 2014-08-13 DIAGNOSIS — R001 Bradycardia, unspecified: Secondary | ICD-10-CM | POA: Diagnosis not present

## 2014-08-13 DIAGNOSIS — R0789 Other chest pain: Secondary | ICD-10-CM | POA: Diagnosis not present

## 2014-08-13 DIAGNOSIS — K219 Gastro-esophageal reflux disease without esophagitis: Secondary | ICD-10-CM | POA: Diagnosis not present

## 2014-08-13 DIAGNOSIS — R079 Chest pain, unspecified: Secondary | ICD-10-CM | POA: Diagnosis not present

## 2014-08-13 DIAGNOSIS — I10 Essential (primary) hypertension: Secondary | ICD-10-CM | POA: Diagnosis not present

## 2014-08-13 DIAGNOSIS — E780 Pure hypercholesterolemia: Secondary | ICD-10-CM | POA: Diagnosis not present

## 2014-08-22 DIAGNOSIS — D72828 Other elevated white blood cell count: Secondary | ICD-10-CM | POA: Diagnosis not present

## 2014-08-22 DIAGNOSIS — E876 Hypokalemia: Secondary | ICD-10-CM | POA: Diagnosis not present

## 2014-08-22 DIAGNOSIS — E0590 Thyrotoxicosis, unspecified without thyrotoxic crisis or storm: Secondary | ICD-10-CM | POA: Diagnosis not present

## 2014-09-12 DIAGNOSIS — I1310 Hypertensive heart and chronic kidney disease without heart failure, with stage 1 through stage 4 chronic kidney disease, or unspecified chronic kidney disease: Secondary | ICD-10-CM | POA: Diagnosis not present

## 2014-09-12 DIAGNOSIS — D72828 Other elevated white blood cell count: Secondary | ICD-10-CM | POA: Diagnosis not present

## 2014-09-12 DIAGNOSIS — E785 Hyperlipidemia, unspecified: Secondary | ICD-10-CM | POA: Diagnosis not present

## 2014-09-12 DIAGNOSIS — K210 Gastro-esophageal reflux disease with esophagitis: Secondary | ICD-10-CM | POA: Diagnosis not present

## 2014-09-12 DIAGNOSIS — E876 Hypokalemia: Secondary | ICD-10-CM | POA: Diagnosis not present

## 2014-09-12 DIAGNOSIS — E559 Vitamin D deficiency, unspecified: Secondary | ICD-10-CM | POA: Diagnosis not present

## 2014-09-12 DIAGNOSIS — E0590 Thyrotoxicosis, unspecified without thyrotoxic crisis or storm: Secondary | ICD-10-CM | POA: Diagnosis not present

## 2014-09-12 DIAGNOSIS — R079 Chest pain, unspecified: Secondary | ICD-10-CM | POA: Diagnosis not present

## 2014-09-12 DIAGNOSIS — Z79899 Other long term (current) drug therapy: Secondary | ICD-10-CM | POA: Diagnosis not present

## 2014-09-12 DIAGNOSIS — N182 Chronic kidney disease, stage 2 (mild): Secondary | ICD-10-CM | POA: Diagnosis not present

## 2014-09-23 DIAGNOSIS — E0590 Thyrotoxicosis, unspecified without thyrotoxic crisis or storm: Secondary | ICD-10-CM | POA: Diagnosis not present

## 2014-09-23 DIAGNOSIS — I1310 Hypertensive heart and chronic kidney disease without heart failure, with stage 1 through stage 4 chronic kidney disease, or unspecified chronic kidney disease: Secondary | ICD-10-CM | POA: Diagnosis not present

## 2014-09-23 DIAGNOSIS — N182 Chronic kidney disease, stage 2 (mild): Secondary | ICD-10-CM | POA: Diagnosis not present

## 2014-09-23 DIAGNOSIS — R079 Chest pain, unspecified: Secondary | ICD-10-CM | POA: Diagnosis not present

## 2014-09-23 DIAGNOSIS — K210 Gastro-esophageal reflux disease with esophagitis: Secondary | ICD-10-CM | POA: Diagnosis not present

## 2014-09-23 DIAGNOSIS — E785 Hyperlipidemia, unspecified: Secondary | ICD-10-CM | POA: Diagnosis not present

## 2014-09-23 DIAGNOSIS — Z79899 Other long term (current) drug therapy: Secondary | ICD-10-CM | POA: Diagnosis not present

## 2014-09-23 DIAGNOSIS — E559 Vitamin D deficiency, unspecified: Secondary | ICD-10-CM | POA: Diagnosis not present

## 2014-11-04 DIAGNOSIS — J45909 Unspecified asthma, uncomplicated: Secondary | ICD-10-CM | POA: Diagnosis not present

## 2014-11-04 DIAGNOSIS — R05 Cough: Secondary | ICD-10-CM | POA: Diagnosis not present

## 2014-11-04 DIAGNOSIS — J4520 Mild intermittent asthma, uncomplicated: Secondary | ICD-10-CM | POA: Diagnosis not present

## 2014-11-27 DIAGNOSIS — Z79899 Other long term (current) drug therapy: Secondary | ICD-10-CM | POA: Diagnosis not present

## 2014-11-27 DIAGNOSIS — I1310 Hypertensive heart and chronic kidney disease without heart failure, with stage 1 through stage 4 chronic kidney disease, or unspecified chronic kidney disease: Secondary | ICD-10-CM | POA: Diagnosis not present

## 2014-11-27 DIAGNOSIS — R7989 Other specified abnormal findings of blood chemistry: Secondary | ICD-10-CM | POA: Diagnosis not present

## 2014-11-27 DIAGNOSIS — K210 Gastro-esophageal reflux disease with esophagitis: Secondary | ICD-10-CM | POA: Diagnosis not present

## 2014-11-27 DIAGNOSIS — N182 Chronic kidney disease, stage 2 (mild): Secondary | ICD-10-CM | POA: Diagnosis not present

## 2014-11-27 DIAGNOSIS — E0590 Thyrotoxicosis, unspecified without thyrotoxic crisis or storm: Secondary | ICD-10-CM | POA: Diagnosis not present

## 2014-11-27 DIAGNOSIS — E559 Vitamin D deficiency, unspecified: Secondary | ICD-10-CM | POA: Diagnosis not present

## 2014-11-27 DIAGNOSIS — E785 Hyperlipidemia, unspecified: Secondary | ICD-10-CM | POA: Diagnosis not present

## 2014-12-01 DIAGNOSIS — J209 Acute bronchitis, unspecified: Secondary | ICD-10-CM | POA: Diagnosis not present

## 2014-12-01 DIAGNOSIS — R509 Fever, unspecified: Secondary | ICD-10-CM | POA: Diagnosis not present

## 2014-12-01 DIAGNOSIS — J0100 Acute maxillary sinusitis, unspecified: Secondary | ICD-10-CM | POA: Diagnosis not present

## 2014-12-13 DIAGNOSIS — Z23 Encounter for immunization: Secondary | ICD-10-CM | POA: Diagnosis not present

## 2014-12-25 DIAGNOSIS — J4520 Mild intermittent asthma, uncomplicated: Secondary | ICD-10-CM | POA: Diagnosis not present

## 2014-12-25 DIAGNOSIS — R05 Cough: Secondary | ICD-10-CM | POA: Diagnosis not present

## 2014-12-25 DIAGNOSIS — R0602 Shortness of breath: Secondary | ICD-10-CM | POA: Diagnosis not present

## 2014-12-25 DIAGNOSIS — Z79899 Other long term (current) drug therapy: Secondary | ICD-10-CM | POA: Diagnosis not present

## 2015-01-15 DIAGNOSIS — J4520 Mild intermittent asthma, uncomplicated: Secondary | ICD-10-CM | POA: Diagnosis not present

## 2015-01-15 DIAGNOSIS — Z87898 Personal history of other specified conditions: Secondary | ICD-10-CM | POA: Diagnosis not present

## 2015-01-15 DIAGNOSIS — R05 Cough: Secondary | ICD-10-CM | POA: Diagnosis not present

## 2015-01-31 DIAGNOSIS — R05 Cough: Secondary | ICD-10-CM | POA: Diagnosis not present

## 2015-01-31 DIAGNOSIS — J309 Allergic rhinitis, unspecified: Secondary | ICD-10-CM | POA: Diagnosis not present

## 2015-01-31 DIAGNOSIS — J4520 Mild intermittent asthma, uncomplicated: Secondary | ICD-10-CM | POA: Diagnosis not present

## 2015-01-31 DIAGNOSIS — R0602 Shortness of breath: Secondary | ICD-10-CM | POA: Diagnosis not present

## 2015-02-03 DIAGNOSIS — N816 Rectocele: Secondary | ICD-10-CM | POA: Diagnosis not present

## 2015-03-03 DIAGNOSIS — R05 Cough: Secondary | ICD-10-CM | POA: Diagnosis not present

## 2015-03-03 DIAGNOSIS — R0602 Shortness of breath: Secondary | ICD-10-CM | POA: Diagnosis not present

## 2015-03-03 DIAGNOSIS — J209 Acute bronchitis, unspecified: Secondary | ICD-10-CM | POA: Diagnosis not present

## 2015-03-05 DIAGNOSIS — J209 Acute bronchitis, unspecified: Secondary | ICD-10-CM | POA: Diagnosis not present

## 2015-03-05 DIAGNOSIS — I10 Essential (primary) hypertension: Secondary | ICD-10-CM | POA: Diagnosis not present

## 2015-03-05 DIAGNOSIS — Z79899 Other long term (current) drug therapy: Secondary | ICD-10-CM | POA: Diagnosis not present

## 2015-03-05 DIAGNOSIS — R0600 Dyspnea, unspecified: Secondary | ICD-10-CM | POA: Diagnosis not present

## 2015-03-05 DIAGNOSIS — R0602 Shortness of breath: Secondary | ICD-10-CM | POA: Diagnosis not present

## 2015-03-05 DIAGNOSIS — E7800 Pure hypercholesterolemia, unspecified: Secondary | ICD-10-CM | POA: Diagnosis not present

## 2015-03-05 DIAGNOSIS — K219 Gastro-esophageal reflux disease without esophagitis: Secondary | ICD-10-CM | POA: Diagnosis not present

## 2015-03-10 DIAGNOSIS — R0602 Shortness of breath: Secondary | ICD-10-CM | POA: Diagnosis not present

## 2015-03-10 DIAGNOSIS — J209 Acute bronchitis, unspecified: Secondary | ICD-10-CM | POA: Diagnosis not present

## 2015-03-10 DIAGNOSIS — D72829 Elevated white blood cell count, unspecified: Secondary | ICD-10-CM | POA: Diagnosis not present

## 2015-03-18 DIAGNOSIS — J4530 Mild persistent asthma, uncomplicated: Secondary | ICD-10-CM | POA: Diagnosis not present

## 2015-03-21 DIAGNOSIS — R0600 Dyspnea, unspecified: Secondary | ICD-10-CM | POA: Diagnosis not present

## 2015-04-23 DIAGNOSIS — J4530 Mild persistent asthma, uncomplicated: Secondary | ICD-10-CM | POA: Diagnosis not present

## 2015-05-16 DIAGNOSIS — Z86010 Personal history of colonic polyps: Secondary | ICD-10-CM | POA: Diagnosis not present

## 2015-05-16 DIAGNOSIS — Z1211 Encounter for screening for malignant neoplasm of colon: Secondary | ICD-10-CM | POA: Diagnosis not present

## 2015-05-30 DIAGNOSIS — Z9181 History of falling: Secondary | ICD-10-CM | POA: Diagnosis not present

## 2015-05-30 DIAGNOSIS — Z136 Encounter for screening for cardiovascular disorders: Secondary | ICD-10-CM | POA: Diagnosis not present

## 2015-05-30 DIAGNOSIS — Z79899 Other long term (current) drug therapy: Secondary | ICD-10-CM | POA: Diagnosis not present

## 2015-05-30 DIAGNOSIS — Z1389 Encounter for screening for other disorder: Secondary | ICD-10-CM | POA: Diagnosis not present

## 2015-05-30 DIAGNOSIS — E559 Vitamin D deficiency, unspecified: Secondary | ICD-10-CM | POA: Diagnosis not present

## 2015-05-30 DIAGNOSIS — J45909 Unspecified asthma, uncomplicated: Secondary | ICD-10-CM | POA: Diagnosis not present

## 2015-05-30 DIAGNOSIS — Z0000 Encounter for general adult medical examination without abnormal findings: Secondary | ICD-10-CM | POA: Diagnosis not present

## 2015-05-30 DIAGNOSIS — J309 Allergic rhinitis, unspecified: Secondary | ICD-10-CM | POA: Diagnosis not present

## 2015-05-30 DIAGNOSIS — I10 Essential (primary) hypertension: Secondary | ICD-10-CM | POA: Diagnosis not present

## 2015-05-30 DIAGNOSIS — E785 Hyperlipidemia, unspecified: Secondary | ICD-10-CM | POA: Diagnosis not present

## 2015-05-30 DIAGNOSIS — Z1231 Encounter for screening mammogram for malignant neoplasm of breast: Secondary | ICD-10-CM | POA: Diagnosis not present

## 2015-06-06 DIAGNOSIS — K649 Unspecified hemorrhoids: Secondary | ICD-10-CM | POA: Diagnosis not present

## 2015-06-06 DIAGNOSIS — Z1211 Encounter for screening for malignant neoplasm of colon: Secondary | ICD-10-CM | POA: Diagnosis not present

## 2015-06-06 DIAGNOSIS — K5730 Diverticulosis of large intestine without perforation or abscess without bleeding: Secondary | ICD-10-CM | POA: Diagnosis not present

## 2015-06-06 DIAGNOSIS — Z86010 Personal history of colonic polyps: Secondary | ICD-10-CM | POA: Diagnosis not present

## 2015-06-06 HISTORY — PX: COLONOSCOPY: SHX174

## 2015-06-13 DIAGNOSIS — Z1231 Encounter for screening mammogram for malignant neoplasm of breast: Secondary | ICD-10-CM | POA: Diagnosis not present

## 2015-07-23 DIAGNOSIS — J4521 Mild intermittent asthma with (acute) exacerbation: Secondary | ICD-10-CM | POA: Diagnosis not present

## 2015-07-23 DIAGNOSIS — J301 Allergic rhinitis due to pollen: Secondary | ICD-10-CM | POA: Diagnosis not present

## 2015-07-31 DIAGNOSIS — J4521 Mild intermittent asthma with (acute) exacerbation: Secondary | ICD-10-CM | POA: Diagnosis not present

## 2015-07-31 DIAGNOSIS — J301 Allergic rhinitis due to pollen: Secondary | ICD-10-CM | POA: Diagnosis not present

## 2015-08-11 DIAGNOSIS — J301 Allergic rhinitis due to pollen: Secondary | ICD-10-CM | POA: Diagnosis not present

## 2015-09-25 DIAGNOSIS — R05 Cough: Secondary | ICD-10-CM | POA: Diagnosis not present

## 2015-09-25 DIAGNOSIS — J45909 Unspecified asthma, uncomplicated: Secondary | ICD-10-CM | POA: Diagnosis not present

## 2015-09-25 DIAGNOSIS — K644 Residual hemorrhoidal skin tags: Secondary | ICD-10-CM | POA: Diagnosis not present

## 2015-10-19 DIAGNOSIS — R0600 Dyspnea, unspecified: Secondary | ICD-10-CM | POA: Diagnosis not present

## 2015-10-19 DIAGNOSIS — J189 Pneumonia, unspecified organism: Secondary | ICD-10-CM | POA: Diagnosis not present

## 2015-10-19 DIAGNOSIS — R05 Cough: Secondary | ICD-10-CM | POA: Diagnosis not present

## 2015-10-24 DIAGNOSIS — J301 Allergic rhinitis due to pollen: Secondary | ICD-10-CM | POA: Diagnosis not present

## 2015-10-24 DIAGNOSIS — J4530 Mild persistent asthma, uncomplicated: Secondary | ICD-10-CM | POA: Diagnosis not present

## 2015-11-07 DIAGNOSIS — H5213 Myopia, bilateral: Secondary | ICD-10-CM | POA: Diagnosis not present

## 2015-11-23 DIAGNOSIS — M5430 Sciatica, unspecified side: Secondary | ICD-10-CM | POA: Diagnosis not present

## 2015-11-23 DIAGNOSIS — M545 Low back pain: Secondary | ICD-10-CM | POA: Diagnosis not present

## 2015-11-28 DIAGNOSIS — E785 Hyperlipidemia, unspecified: Secondary | ICD-10-CM | POA: Diagnosis not present

## 2015-11-28 DIAGNOSIS — E559 Vitamin D deficiency, unspecified: Secondary | ICD-10-CM | POA: Diagnosis not present

## 2015-11-28 DIAGNOSIS — Z79899 Other long term (current) drug therapy: Secondary | ICD-10-CM | POA: Diagnosis not present

## 2015-11-28 DIAGNOSIS — J45909 Unspecified asthma, uncomplicated: Secondary | ICD-10-CM | POA: Diagnosis not present

## 2015-11-28 DIAGNOSIS — I10 Essential (primary) hypertension: Secondary | ICD-10-CM | POA: Diagnosis not present

## 2015-11-28 DIAGNOSIS — Z87898 Personal history of other specified conditions: Secondary | ICD-10-CM | POA: Diagnosis not present

## 2016-01-30 DIAGNOSIS — J4530 Mild persistent asthma, uncomplicated: Secondary | ICD-10-CM | POA: Diagnosis not present

## 2016-01-30 DIAGNOSIS — J301 Allergic rhinitis due to pollen: Secondary | ICD-10-CM | POA: Diagnosis not present

## 2016-01-30 DIAGNOSIS — Z23 Encounter for immunization: Secondary | ICD-10-CM | POA: Diagnosis not present

## 2016-03-18 DIAGNOSIS — J0100 Acute maxillary sinusitis, unspecified: Secondary | ICD-10-CM | POA: Diagnosis not present

## 2016-03-18 DIAGNOSIS — R062 Wheezing: Secondary | ICD-10-CM | POA: Diagnosis not present

## 2016-03-18 DIAGNOSIS — J209 Acute bronchitis, unspecified: Secondary | ICD-10-CM | POA: Diagnosis not present

## 2016-03-18 DIAGNOSIS — R0602 Shortness of breath: Secondary | ICD-10-CM | POA: Diagnosis not present

## 2016-04-02 DIAGNOSIS — J45909 Unspecified asthma, uncomplicated: Secondary | ICD-10-CM | POA: Diagnosis not present

## 2016-04-02 DIAGNOSIS — E785 Hyperlipidemia, unspecified: Secondary | ICD-10-CM | POA: Diagnosis not present

## 2016-04-02 DIAGNOSIS — I10 Essential (primary) hypertension: Secondary | ICD-10-CM | POA: Diagnosis not present

## 2016-04-02 DIAGNOSIS — K644 Residual hemorrhoidal skin tags: Secondary | ICD-10-CM | POA: Diagnosis not present

## 2016-04-02 DIAGNOSIS — Z87898 Personal history of other specified conditions: Secondary | ICD-10-CM | POA: Diagnosis not present

## 2016-04-02 DIAGNOSIS — E559 Vitamin D deficiency, unspecified: Secondary | ICD-10-CM | POA: Diagnosis not present

## 2016-04-02 DIAGNOSIS — Z79899 Other long term (current) drug therapy: Secondary | ICD-10-CM | POA: Diagnosis not present

## 2016-04-24 DIAGNOSIS — R05 Cough: Secondary | ICD-10-CM | POA: Diagnosis not present

## 2016-04-24 DIAGNOSIS — R0602 Shortness of breath: Secondary | ICD-10-CM | POA: Diagnosis not present

## 2016-04-24 DIAGNOSIS — J069 Acute upper respiratory infection, unspecified: Secondary | ICD-10-CM | POA: Diagnosis not present

## 2016-04-24 DIAGNOSIS — R0600 Dyspnea, unspecified: Secondary | ICD-10-CM | POA: Diagnosis not present

## 2016-04-30 DIAGNOSIS — J4530 Mild persistent asthma, uncomplicated: Secondary | ICD-10-CM | POA: Diagnosis not present

## 2016-04-30 DIAGNOSIS — J180 Bronchopneumonia, unspecified organism: Secondary | ICD-10-CM | POA: Diagnosis not present

## 2016-06-08 DIAGNOSIS — Z136 Encounter for screening for cardiovascular disorders: Secondary | ICD-10-CM | POA: Diagnosis not present

## 2016-06-08 DIAGNOSIS — I10 Essential (primary) hypertension: Secondary | ICD-10-CM | POA: Diagnosis not present

## 2016-06-08 DIAGNOSIS — E785 Hyperlipidemia, unspecified: Secondary | ICD-10-CM | POA: Diagnosis not present

## 2016-06-09 DIAGNOSIS — Z1211 Encounter for screening for malignant neoplasm of colon: Secondary | ICD-10-CM | POA: Diagnosis not present

## 2016-06-11 DIAGNOSIS — R8290 Unspecified abnormal findings in urine: Secondary | ICD-10-CM | POA: Diagnosis not present

## 2016-07-27 DIAGNOSIS — J45909 Unspecified asthma, uncomplicated: Secondary | ICD-10-CM | POA: Diagnosis not present

## 2016-07-27 DIAGNOSIS — I10 Essential (primary) hypertension: Secondary | ICD-10-CM | POA: Diagnosis not present

## 2016-07-27 DIAGNOSIS — E785 Hyperlipidemia, unspecified: Secondary | ICD-10-CM | POA: Diagnosis not present

## 2016-07-27 DIAGNOSIS — E559 Vitamin D deficiency, unspecified: Secondary | ICD-10-CM | POA: Diagnosis not present

## 2016-08-06 DIAGNOSIS — J4530 Mild persistent asthma, uncomplicated: Secondary | ICD-10-CM | POA: Diagnosis not present

## 2016-08-06 DIAGNOSIS — M85852 Other specified disorders of bone density and structure, left thigh: Secondary | ICD-10-CM | POA: Diagnosis not present

## 2016-08-06 DIAGNOSIS — N959 Unspecified menopausal and perimenopausal disorder: Secondary | ICD-10-CM | POA: Diagnosis not present

## 2016-08-06 DIAGNOSIS — Z1231 Encounter for screening mammogram for malignant neoplasm of breast: Secondary | ICD-10-CM | POA: Diagnosis not present

## 2016-08-06 DIAGNOSIS — M8580 Other specified disorders of bone density and structure, unspecified site: Secondary | ICD-10-CM | POA: Diagnosis not present

## 2016-10-15 DIAGNOSIS — J45901 Unspecified asthma with (acute) exacerbation: Secondary | ICD-10-CM | POA: Diagnosis not present

## 2016-10-17 DIAGNOSIS — R0602 Shortness of breath: Secondary | ICD-10-CM | POA: Diagnosis not present

## 2016-10-17 DIAGNOSIS — R05 Cough: Secondary | ICD-10-CM | POA: Diagnosis not present

## 2016-10-17 DIAGNOSIS — J45901 Unspecified asthma with (acute) exacerbation: Secondary | ICD-10-CM | POA: Diagnosis not present

## 2016-10-25 DIAGNOSIS — Z1389 Encounter for screening for other disorder: Secondary | ICD-10-CM | POA: Diagnosis not present

## 2016-10-25 DIAGNOSIS — J45901 Unspecified asthma with (acute) exacerbation: Secondary | ICD-10-CM | POA: Diagnosis not present

## 2016-11-04 DIAGNOSIS — E663 Overweight: Secondary | ICD-10-CM | POA: Diagnosis not present

## 2016-11-04 DIAGNOSIS — J45909 Unspecified asthma, uncomplicated: Secondary | ICD-10-CM | POA: Diagnosis not present

## 2016-11-04 DIAGNOSIS — E785 Hyperlipidemia, unspecified: Secondary | ICD-10-CM | POA: Diagnosis not present

## 2016-11-04 DIAGNOSIS — E559 Vitamin D deficiency, unspecified: Secondary | ICD-10-CM | POA: Diagnosis not present

## 2016-11-04 DIAGNOSIS — I10 Essential (primary) hypertension: Secondary | ICD-10-CM | POA: Diagnosis not present

## 2017-01-28 DIAGNOSIS — H2513 Age-related nuclear cataract, bilateral: Secondary | ICD-10-CM | POA: Diagnosis not present

## 2017-02-04 DIAGNOSIS — M1990 Unspecified osteoarthritis, unspecified site: Secondary | ICD-10-CM | POA: Diagnosis present

## 2017-02-04 DIAGNOSIS — R509 Fever, unspecified: Secondary | ICD-10-CM | POA: Diagnosis not present

## 2017-02-04 DIAGNOSIS — A084 Viral intestinal infection, unspecified: Secondary | ICD-10-CM | POA: Diagnosis present

## 2017-02-04 DIAGNOSIS — Z7982 Long term (current) use of aspirin: Secondary | ICD-10-CM | POA: Diagnosis not present

## 2017-02-04 DIAGNOSIS — J4531 Mild persistent asthma with (acute) exacerbation: Secondary | ICD-10-CM | POA: Diagnosis not present

## 2017-02-04 DIAGNOSIS — K8020 Calculus of gallbladder without cholecystitis without obstruction: Secondary | ICD-10-CM | POA: Diagnosis not present

## 2017-02-04 DIAGNOSIS — Z79899 Other long term (current) drug therapy: Secondary | ICD-10-CM | POA: Diagnosis not present

## 2017-02-04 DIAGNOSIS — E785 Hyperlipidemia, unspecified: Secondary | ICD-10-CM | POA: Diagnosis not present

## 2017-02-04 DIAGNOSIS — J129 Viral pneumonia, unspecified: Secondary | ICD-10-CM

## 2017-02-04 DIAGNOSIS — K529 Noninfective gastroenteritis and colitis, unspecified: Secondary | ICD-10-CM

## 2017-02-04 DIAGNOSIS — B961 Klebsiella pneumoniae [K. pneumoniae] as the cause of diseases classified elsewhere: Secondary | ICD-10-CM | POA: Diagnosis not present

## 2017-02-04 DIAGNOSIS — R945 Abnormal results of liver function studies: Secondary | ICD-10-CM

## 2017-02-04 DIAGNOSIS — R05 Cough: Secondary | ICD-10-CM | POA: Diagnosis not present

## 2017-02-04 DIAGNOSIS — I10 Essential (primary) hypertension: Secondary | ICD-10-CM

## 2017-02-04 DIAGNOSIS — E7800 Pure hypercholesterolemia, unspecified: Secondary | ICD-10-CM

## 2017-02-04 DIAGNOSIS — R0902 Hypoxemia: Secondary | ICD-10-CM | POA: Diagnosis not present

## 2017-02-04 DIAGNOSIS — E876 Hypokalemia: Secondary | ICD-10-CM

## 2017-02-04 DIAGNOSIS — R1013 Epigastric pain: Secondary | ICD-10-CM | POA: Diagnosis not present

## 2017-02-04 DIAGNOSIS — J9601 Acute respiratory failure with hypoxia: Secondary | ICD-10-CM

## 2017-02-04 DIAGNOSIS — R7881 Bacteremia: Secondary | ICD-10-CM | POA: Diagnosis not present

## 2017-02-04 DIAGNOSIS — Z23 Encounter for immunization: Secondary | ICD-10-CM | POA: Diagnosis not present

## 2017-02-04 DIAGNOSIS — K219 Gastro-esophageal reflux disease without esophagitis: Secondary | ICD-10-CM

## 2017-02-11 DIAGNOSIS — J45909 Unspecified asthma, uncomplicated: Secondary | ICD-10-CM | POA: Diagnosis not present

## 2017-02-11 DIAGNOSIS — K8020 Calculus of gallbladder without cholecystitis without obstruction: Secondary | ICD-10-CM | POA: Diagnosis not present

## 2017-02-11 DIAGNOSIS — K7689 Other specified diseases of liver: Secondary | ICD-10-CM | POA: Diagnosis not present

## 2017-02-11 DIAGNOSIS — Z9189 Other specified personal risk factors, not elsewhere classified: Secondary | ICD-10-CM | POA: Diagnosis not present

## 2017-02-11 DIAGNOSIS — K529 Noninfective gastroenteritis and colitis, unspecified: Secondary | ICD-10-CM | POA: Diagnosis not present

## 2017-03-11 DIAGNOSIS — E663 Overweight: Secondary | ICD-10-CM | POA: Diagnosis not present

## 2017-03-11 DIAGNOSIS — I10 Essential (primary) hypertension: Secondary | ICD-10-CM | POA: Diagnosis not present

## 2017-03-11 DIAGNOSIS — J45909 Unspecified asthma, uncomplicated: Secondary | ICD-10-CM | POA: Diagnosis not present

## 2017-03-11 DIAGNOSIS — E785 Hyperlipidemia, unspecified: Secondary | ICD-10-CM | POA: Diagnosis not present

## 2017-03-11 DIAGNOSIS — E559 Vitamin D deficiency, unspecified: Secondary | ICD-10-CM | POA: Diagnosis not present

## 2017-03-22 DIAGNOSIS — K838 Other specified diseases of biliary tract: Secondary | ICD-10-CM | POA: Diagnosis not present

## 2017-03-22 DIAGNOSIS — K8020 Calculus of gallbladder without cholecystitis without obstruction: Secondary | ICD-10-CM | POA: Diagnosis not present

## 2017-03-24 DIAGNOSIS — K8010 Calculus of gallbladder with chronic cholecystitis without obstruction: Secondary | ICD-10-CM | POA: Diagnosis not present

## 2017-04-05 HISTORY — PX: CHOLECYSTECTOMY: SHX55

## 2017-04-05 HISTORY — PX: CATARACT EXTRACTION, BILATERAL: SHX1313

## 2017-04-15 DIAGNOSIS — H2511 Age-related nuclear cataract, right eye: Secondary | ICD-10-CM | POA: Diagnosis not present

## 2017-04-15 DIAGNOSIS — H2513 Age-related nuclear cataract, bilateral: Secondary | ICD-10-CM | POA: Diagnosis not present

## 2017-04-15 DIAGNOSIS — H25043 Posterior subcapsular polar age-related cataract, bilateral: Secondary | ICD-10-CM | POA: Diagnosis not present

## 2017-04-15 DIAGNOSIS — H25013 Cortical age-related cataract, bilateral: Secondary | ICD-10-CM | POA: Diagnosis not present

## 2017-04-15 DIAGNOSIS — H02839 Dermatochalasis of unspecified eye, unspecified eyelid: Secondary | ICD-10-CM | POA: Diagnosis not present

## 2017-05-06 DIAGNOSIS — H2511 Age-related nuclear cataract, right eye: Secondary | ICD-10-CM | POA: Diagnosis not present

## 2017-05-06 DIAGNOSIS — H2512 Age-related nuclear cataract, left eye: Secondary | ICD-10-CM | POA: Diagnosis not present

## 2017-05-10 DIAGNOSIS — J4521 Mild intermittent asthma with (acute) exacerbation: Secondary | ICD-10-CM | POA: Diagnosis not present

## 2017-05-10 DIAGNOSIS — J069 Acute upper respiratory infection, unspecified: Secondary | ICD-10-CM | POA: Diagnosis not present

## 2017-05-12 DIAGNOSIS — K8010 Calculus of gallbladder with chronic cholecystitis without obstruction: Secondary | ICD-10-CM | POA: Diagnosis not present

## 2017-05-20 DIAGNOSIS — H2512 Age-related nuclear cataract, left eye: Secondary | ICD-10-CM | POA: Diagnosis not present

## 2017-05-25 DIAGNOSIS — J4530 Mild persistent asthma, uncomplicated: Secondary | ICD-10-CM | POA: Diagnosis not present

## 2017-05-30 DIAGNOSIS — R945 Abnormal results of liver function studies: Secondary | ICD-10-CM | POA: Diagnosis not present

## 2017-05-30 DIAGNOSIS — K8010 Calculus of gallbladder with chronic cholecystitis without obstruction: Secondary | ICD-10-CM | POA: Diagnosis not present

## 2017-05-30 DIAGNOSIS — K8020 Calculus of gallbladder without cholecystitis without obstruction: Secondary | ICD-10-CM | POA: Diagnosis not present

## 2017-05-30 DIAGNOSIS — K7581 Nonalcoholic steatohepatitis (NASH): Secondary | ICD-10-CM | POA: Diagnosis not present

## 2017-05-30 DIAGNOSIS — K8050 Calculus of bile duct without cholangitis or cholecystitis without obstruction: Secondary | ICD-10-CM | POA: Diagnosis not present

## 2017-05-30 DIAGNOSIS — K759 Inflammatory liver disease, unspecified: Secondary | ICD-10-CM | POA: Diagnosis not present

## 2017-05-31 DIAGNOSIS — J449 Chronic obstructive pulmonary disease, unspecified: Secondary | ICD-10-CM | POA: Diagnosis not present

## 2017-05-31 DIAGNOSIS — K8020 Calculus of gallbladder without cholecystitis without obstruction: Secondary | ICD-10-CM | POA: Diagnosis not present

## 2017-05-31 DIAGNOSIS — K8050 Calculus of bile duct without cholangitis or cholecystitis without obstruction: Secondary | ICD-10-CM | POA: Diagnosis not present

## 2017-05-31 DIAGNOSIS — J45909 Unspecified asthma, uncomplicated: Secondary | ICD-10-CM | POA: Diagnosis not present

## 2017-06-01 DIAGNOSIS — E559 Vitamin D deficiency, unspecified: Secondary | ICD-10-CM | POA: Diagnosis present

## 2017-06-01 DIAGNOSIS — K8070 Calculus of gallbladder and bile duct without cholecystitis without obstruction: Secondary | ICD-10-CM | POA: Diagnosis not present

## 2017-06-01 DIAGNOSIS — J449 Chronic obstructive pulmonary disease, unspecified: Secondary | ICD-10-CM | POA: Diagnosis present

## 2017-06-01 DIAGNOSIS — R0902 Hypoxemia: Secondary | ICD-10-CM | POA: Diagnosis not present

## 2017-06-01 DIAGNOSIS — K859 Acute pancreatitis without necrosis or infection, unspecified: Secondary | ICD-10-CM | POA: Diagnosis not present

## 2017-06-01 DIAGNOSIS — M810 Age-related osteoporosis without current pathological fracture: Secondary | ICD-10-CM | POA: Diagnosis present

## 2017-06-01 DIAGNOSIS — J45909 Unspecified asthma, uncomplicated: Secondary | ICD-10-CM | POA: Diagnosis present

## 2017-06-01 DIAGNOSIS — I4891 Unspecified atrial fibrillation: Secondary | ICD-10-CM | POA: Diagnosis not present

## 2017-06-01 DIAGNOSIS — K567 Ileus, unspecified: Secondary | ICD-10-CM | POA: Diagnosis not present

## 2017-06-01 DIAGNOSIS — Z01818 Encounter for other preprocedural examination: Secondary | ICD-10-CM | POA: Diagnosis not present

## 2017-06-01 DIAGNOSIS — I110 Hypertensive heart disease with heart failure: Secondary | ICD-10-CM | POA: Diagnosis present

## 2017-06-01 DIAGNOSIS — K8010 Calculus of gallbladder with chronic cholecystitis without obstruction: Secondary | ICD-10-CM | POA: Diagnosis not present

## 2017-06-01 DIAGNOSIS — K8050 Calculus of bile duct without cholangitis or cholecystitis without obstruction: Secondary | ICD-10-CM | POA: Diagnosis not present

## 2017-06-01 DIAGNOSIS — K219 Gastro-esophageal reflux disease without esophagitis: Secondary | ICD-10-CM | POA: Diagnosis present

## 2017-06-01 DIAGNOSIS — J9601 Acute respiratory failure with hypoxia: Secondary | ICD-10-CM | POA: Diagnosis not present

## 2017-06-01 DIAGNOSIS — K5900 Constipation, unspecified: Secondary | ICD-10-CM | POA: Diagnosis present

## 2017-06-01 DIAGNOSIS — Z7982 Long term (current) use of aspirin: Secondary | ICD-10-CM | POA: Diagnosis not present

## 2017-06-01 DIAGNOSIS — Z79899 Other long term (current) drug therapy: Secondary | ICD-10-CM | POA: Diagnosis not present

## 2017-06-01 DIAGNOSIS — I5033 Acute on chronic diastolic (congestive) heart failure: Secondary | ICD-10-CM | POA: Diagnosis not present

## 2017-06-01 DIAGNOSIS — I10 Essential (primary) hypertension: Secondary | ICD-10-CM | POA: Diagnosis not present

## 2017-06-01 DIAGNOSIS — R0602 Shortness of breath: Secondary | ICD-10-CM | POA: Diagnosis not present

## 2017-06-03 DIAGNOSIS — I10 Essential (primary) hypertension: Secondary | ICD-10-CM

## 2017-06-03 DIAGNOSIS — I4891 Unspecified atrial fibrillation: Secondary | ICD-10-CM

## 2017-06-16 DIAGNOSIS — E785 Hyperlipidemia, unspecified: Secondary | ICD-10-CM | POA: Diagnosis not present

## 2017-06-16 DIAGNOSIS — I10 Essential (primary) hypertension: Secondary | ICD-10-CM | POA: Diagnosis not present

## 2017-06-16 DIAGNOSIS — Z1389 Encounter for screening for other disorder: Secondary | ICD-10-CM | POA: Diagnosis not present

## 2017-06-16 DIAGNOSIS — Z0000 Encounter for general adult medical examination without abnormal findings: Secondary | ICD-10-CM | POA: Diagnosis not present

## 2017-06-16 DIAGNOSIS — Z6830 Body mass index (BMI) 30.0-30.9, adult: Secondary | ICD-10-CM | POA: Diagnosis not present

## 2017-06-16 DIAGNOSIS — E669 Obesity, unspecified: Secondary | ICD-10-CM | POA: Diagnosis not present

## 2017-06-17 DIAGNOSIS — I10 Essential (primary) hypertension: Secondary | ICD-10-CM | POA: Diagnosis not present

## 2017-06-17 DIAGNOSIS — Z0189 Encounter for other specified special examinations: Secondary | ICD-10-CM | POA: Diagnosis not present

## 2017-06-17 DIAGNOSIS — I4891 Unspecified atrial fibrillation: Secondary | ICD-10-CM | POA: Diagnosis not present

## 2017-06-17 DIAGNOSIS — Z8719 Personal history of other diseases of the digestive system: Secondary | ICD-10-CM | POA: Diagnosis not present

## 2017-06-17 DIAGNOSIS — Z8249 Family history of ischemic heart disease and other diseases of the circulatory system: Secondary | ICD-10-CM | POA: Diagnosis not present

## 2017-06-21 DIAGNOSIS — Z1211 Encounter for screening for malignant neoplasm of colon: Secondary | ICD-10-CM | POA: Diagnosis not present

## 2017-06-23 DIAGNOSIS — R945 Abnormal results of liver function studies: Secondary | ICD-10-CM | POA: Diagnosis not present

## 2017-07-16 DIAGNOSIS — I4891 Unspecified atrial fibrillation: Secondary | ICD-10-CM | POA: Diagnosis not present

## 2017-07-20 DIAGNOSIS — Z136 Encounter for screening for cardiovascular disorders: Secondary | ICD-10-CM | POA: Diagnosis not present

## 2017-08-17 DIAGNOSIS — J4520 Mild intermittent asthma, uncomplicated: Secondary | ICD-10-CM | POA: Diagnosis not present

## 2017-09-12 DIAGNOSIS — E7800 Pure hypercholesterolemia, unspecified: Secondary | ICD-10-CM | POA: Diagnosis not present

## 2017-09-12 DIAGNOSIS — I10 Essential (primary) hypertension: Secondary | ICD-10-CM | POA: Diagnosis not present

## 2017-09-12 DIAGNOSIS — E559 Vitamin D deficiency, unspecified: Secondary | ICD-10-CM | POA: Diagnosis not present

## 2017-09-12 DIAGNOSIS — J4520 Mild intermittent asthma, uncomplicated: Secondary | ICD-10-CM | POA: Diagnosis not present

## 2017-10-14 DIAGNOSIS — J189 Pneumonia, unspecified organism: Secondary | ICD-10-CM | POA: Diagnosis not present

## 2017-10-14 DIAGNOSIS — J4521 Mild intermittent asthma with (acute) exacerbation: Secondary | ICD-10-CM | POA: Diagnosis not present

## 2017-10-14 DIAGNOSIS — Z6829 Body mass index (BMI) 29.0-29.9, adult: Secondary | ICD-10-CM | POA: Diagnosis not present

## 2017-10-14 DIAGNOSIS — I10 Essential (primary) hypertension: Secondary | ICD-10-CM | POA: Diagnosis not present

## 2017-10-22 DIAGNOSIS — M940 Chondrocostal junction syndrome [Tietze]: Secondary | ICD-10-CM | POA: Diagnosis not present

## 2017-10-22 DIAGNOSIS — J189 Pneumonia, unspecified organism: Secondary | ICD-10-CM | POA: Diagnosis not present

## 2017-10-22 DIAGNOSIS — R05 Cough: Secondary | ICD-10-CM | POA: Diagnosis not present

## 2017-12-13 DIAGNOSIS — Z961 Presence of intraocular lens: Secondary | ICD-10-CM | POA: Diagnosis not present

## 2018-02-15 DIAGNOSIS — J4520 Mild intermittent asthma, uncomplicated: Secondary | ICD-10-CM | POA: Diagnosis not present

## 2018-03-14 DIAGNOSIS — E7800 Pure hypercholesterolemia, unspecified: Secondary | ICD-10-CM | POA: Diagnosis not present

## 2018-03-14 DIAGNOSIS — J4520 Mild intermittent asthma, uncomplicated: Secondary | ICD-10-CM | POA: Diagnosis not present

## 2018-03-14 DIAGNOSIS — Z23 Encounter for immunization: Secondary | ICD-10-CM | POA: Diagnosis not present

## 2018-03-14 DIAGNOSIS — I10 Essential (primary) hypertension: Secondary | ICD-10-CM | POA: Diagnosis not present

## 2018-04-14 DIAGNOSIS — J069 Acute upper respiratory infection, unspecified: Secondary | ICD-10-CM | POA: Diagnosis not present

## 2018-08-23 DIAGNOSIS — J4520 Mild intermittent asthma, uncomplicated: Secondary | ICD-10-CM | POA: Diagnosis not present

## 2018-09-04 DIAGNOSIS — Z1331 Encounter for screening for depression: Secondary | ICD-10-CM | POA: Diagnosis not present

## 2018-09-04 DIAGNOSIS — E559 Vitamin D deficiency, unspecified: Secondary | ICD-10-CM | POA: Diagnosis not present

## 2018-09-04 DIAGNOSIS — I10 Essential (primary) hypertension: Secondary | ICD-10-CM | POA: Diagnosis not present

## 2018-09-04 DIAGNOSIS — Z9181 History of falling: Secondary | ICD-10-CM | POA: Diagnosis not present

## 2018-09-04 DIAGNOSIS — E7800 Pure hypercholesterolemia, unspecified: Secondary | ICD-10-CM | POA: Diagnosis not present

## 2018-09-26 DIAGNOSIS — J189 Pneumonia, unspecified organism: Secondary | ICD-10-CM | POA: Diagnosis not present

## 2018-09-26 DIAGNOSIS — Z20828 Contact with and (suspected) exposure to other viral communicable diseases: Secondary | ICD-10-CM | POA: Diagnosis not present

## 2018-09-26 DIAGNOSIS — J4520 Mild intermittent asthma, uncomplicated: Secondary | ICD-10-CM | POA: Diagnosis not present

## 2018-10-09 DIAGNOSIS — E785 Hyperlipidemia, unspecified: Secondary | ICD-10-CM | POA: Diagnosis not present

## 2018-10-09 DIAGNOSIS — Z0000 Encounter for general adult medical examination without abnormal findings: Secondary | ICD-10-CM | POA: Diagnosis not present

## 2018-10-09 DIAGNOSIS — Z6830 Body mass index (BMI) 30.0-30.9, adult: Secondary | ICD-10-CM | POA: Diagnosis not present

## 2018-10-09 DIAGNOSIS — Z1331 Encounter for screening for depression: Secondary | ICD-10-CM | POA: Diagnosis not present

## 2018-10-09 DIAGNOSIS — Z1231 Encounter for screening mammogram for malignant neoplasm of breast: Secondary | ICD-10-CM | POA: Diagnosis not present

## 2018-10-09 DIAGNOSIS — Z9181 History of falling: Secondary | ICD-10-CM | POA: Diagnosis not present

## 2018-11-01 DIAGNOSIS — R05 Cough: Secondary | ICD-10-CM | POA: Diagnosis not present

## 2018-11-01 DIAGNOSIS — J069 Acute upper respiratory infection, unspecified: Secondary | ICD-10-CM | POA: Diagnosis not present

## 2018-11-01 DIAGNOSIS — J9601 Acute respiratory failure with hypoxia: Secondary | ICD-10-CM | POA: Diagnosis not present

## 2018-11-01 DIAGNOSIS — J4521 Mild intermittent asthma with (acute) exacerbation: Secondary | ICD-10-CM | POA: Diagnosis not present

## 2018-11-01 DIAGNOSIS — J4530 Mild persistent asthma, uncomplicated: Secondary | ICD-10-CM | POA: Diagnosis not present

## 2018-11-02 DIAGNOSIS — J4521 Mild intermittent asthma with (acute) exacerbation: Secondary | ICD-10-CM | POA: Diagnosis not present

## 2018-11-02 DIAGNOSIS — R05 Cough: Secondary | ICD-10-CM | POA: Diagnosis not present

## 2018-11-02 DIAGNOSIS — J9601 Acute respiratory failure with hypoxia: Secondary | ICD-10-CM | POA: Diagnosis not present

## 2018-11-02 DIAGNOSIS — J069 Acute upper respiratory infection, unspecified: Secondary | ICD-10-CM | POA: Diagnosis not present

## 2018-11-03 DIAGNOSIS — J4521 Mild intermittent asthma with (acute) exacerbation: Secondary | ICD-10-CM | POA: Diagnosis not present

## 2018-11-03 DIAGNOSIS — J9601 Acute respiratory failure with hypoxia: Secondary | ICD-10-CM | POA: Diagnosis not present

## 2018-11-03 DIAGNOSIS — J069 Acute upper respiratory infection, unspecified: Secondary | ICD-10-CM | POA: Diagnosis not present

## 2018-11-08 DIAGNOSIS — J9621 Acute and chronic respiratory failure with hypoxia: Secondary | ICD-10-CM | POA: Diagnosis not present

## 2018-11-10 DIAGNOSIS — J4530 Mild persistent asthma, uncomplicated: Secondary | ICD-10-CM | POA: Diagnosis not present

## 2018-12-19 DIAGNOSIS — J4531 Mild persistent asthma with (acute) exacerbation: Secondary | ICD-10-CM | POA: Diagnosis not present

## 2018-12-19 DIAGNOSIS — J9601 Acute respiratory failure with hypoxia: Secondary | ICD-10-CM | POA: Diagnosis not present

## 2018-12-20 DIAGNOSIS — R05 Cough: Secondary | ICD-10-CM | POA: Diagnosis not present

## 2018-12-20 DIAGNOSIS — J4531 Mild persistent asthma with (acute) exacerbation: Secondary | ICD-10-CM | POA: Diagnosis not present

## 2018-12-20 DIAGNOSIS — J9601 Acute respiratory failure with hypoxia: Secondary | ICD-10-CM | POA: Diagnosis not present

## 2018-12-21 DIAGNOSIS — J189 Pneumonia, unspecified organism: Secondary | ICD-10-CM | POA: Diagnosis not present

## 2018-12-21 DIAGNOSIS — R05 Cough: Secondary | ICD-10-CM | POA: Diagnosis not present

## 2018-12-21 DIAGNOSIS — J4521 Mild intermittent asthma with (acute) exacerbation: Secondary | ICD-10-CM | POA: Diagnosis not present

## 2018-12-22 DIAGNOSIS — J4521 Mild intermittent asthma with (acute) exacerbation: Secondary | ICD-10-CM | POA: Diagnosis not present

## 2018-12-22 DIAGNOSIS — J189 Pneumonia, unspecified organism: Secondary | ICD-10-CM | POA: Diagnosis not present

## 2018-12-22 DIAGNOSIS — R05 Cough: Secondary | ICD-10-CM | POA: Diagnosis not present

## 2018-12-25 DIAGNOSIS — J4521 Mild intermittent asthma with (acute) exacerbation: Secondary | ICD-10-CM | POA: Diagnosis not present

## 2018-12-25 DIAGNOSIS — R05 Cough: Secondary | ICD-10-CM | POA: Diagnosis not present

## 2018-12-25 DIAGNOSIS — J189 Pneumonia, unspecified organism: Secondary | ICD-10-CM | POA: Diagnosis not present

## 2018-12-29 DIAGNOSIS — Z23 Encounter for immunization: Secondary | ICD-10-CM | POA: Diagnosis not present

## 2019-01-08 DIAGNOSIS — R05 Cough: Secondary | ICD-10-CM | POA: Diagnosis not present

## 2019-01-08 DIAGNOSIS — J189 Pneumonia, unspecified organism: Secondary | ICD-10-CM | POA: Diagnosis not present

## 2019-01-08 DIAGNOSIS — J4521 Mild intermittent asthma with (acute) exacerbation: Secondary | ICD-10-CM | POA: Diagnosis not present

## 2019-03-08 DIAGNOSIS — E7800 Pure hypercholesterolemia, unspecified: Secondary | ICD-10-CM | POA: Diagnosis not present

## 2019-03-08 DIAGNOSIS — I10 Essential (primary) hypertension: Secondary | ICD-10-CM | POA: Diagnosis not present

## 2019-03-08 DIAGNOSIS — Z139 Encounter for screening, unspecified: Secondary | ICD-10-CM | POA: Diagnosis not present

## 2019-03-08 DIAGNOSIS — E559 Vitamin D deficiency, unspecified: Secondary | ICD-10-CM | POA: Diagnosis not present

## 2019-03-08 DIAGNOSIS — Z6829 Body mass index (BMI) 29.0-29.9, adult: Secondary | ICD-10-CM | POA: Diagnosis not present

## 2019-04-06 HISTORY — PX: JOINT REPLACEMENT: SHX530

## 2019-04-11 DIAGNOSIS — R062 Wheezing: Secondary | ICD-10-CM | POA: Diagnosis not present

## 2019-04-11 DIAGNOSIS — J4520 Mild intermittent asthma, uncomplicated: Secondary | ICD-10-CM | POA: Diagnosis not present

## 2019-04-28 DIAGNOSIS — R0602 Shortness of breath: Secondary | ICD-10-CM | POA: Diagnosis not present

## 2019-04-28 DIAGNOSIS — R05 Cough: Secondary | ICD-10-CM | POA: Diagnosis not present

## 2019-04-28 DIAGNOSIS — R509 Fever, unspecified: Secondary | ICD-10-CM | POA: Diagnosis not present

## 2019-04-28 DIAGNOSIS — Z20828 Contact with and (suspected) exposure to other viral communicable diseases: Secondary | ICD-10-CM | POA: Diagnosis not present

## 2019-05-15 DIAGNOSIS — R062 Wheezing: Secondary | ICD-10-CM | POA: Diagnosis not present

## 2019-05-15 DIAGNOSIS — J4541 Moderate persistent asthma with (acute) exacerbation: Secondary | ICD-10-CM | POA: Diagnosis not present

## 2019-05-18 DIAGNOSIS — I2699 Other pulmonary embolism without acute cor pulmonale: Secondary | ICD-10-CM | POA: Diagnosis not present

## 2019-05-18 DIAGNOSIS — R0602 Shortness of breath: Secondary | ICD-10-CM | POA: Diagnosis not present

## 2019-05-22 DIAGNOSIS — J4541 Moderate persistent asthma with (acute) exacerbation: Secondary | ICD-10-CM | POA: Diagnosis not present

## 2019-05-22 DIAGNOSIS — R062 Wheezing: Secondary | ICD-10-CM | POA: Diagnosis not present

## 2019-06-06 DIAGNOSIS — Z23 Encounter for immunization: Secondary | ICD-10-CM | POA: Diagnosis not present

## 2019-06-15 DIAGNOSIS — H5202 Hypermetropia, left eye: Secondary | ICD-10-CM | POA: Diagnosis not present

## 2019-06-15 DIAGNOSIS — H5211 Myopia, right eye: Secondary | ICD-10-CM | POA: Diagnosis not present

## 2019-06-15 DIAGNOSIS — Z961 Presence of intraocular lens: Secondary | ICD-10-CM | POA: Diagnosis not present

## 2019-07-03 DIAGNOSIS — Z23 Encounter for immunization: Secondary | ICD-10-CM | POA: Diagnosis not present

## 2019-07-18 DIAGNOSIS — J4530 Mild persistent asthma, uncomplicated: Secondary | ICD-10-CM | POA: Diagnosis not present

## 2019-07-18 DIAGNOSIS — J9611 Chronic respiratory failure with hypoxia: Secondary | ICD-10-CM | POA: Diagnosis not present

## 2019-09-06 DIAGNOSIS — E559 Vitamin D deficiency, unspecified: Secondary | ICD-10-CM | POA: Diagnosis not present

## 2019-09-06 DIAGNOSIS — Z6829 Body mass index (BMI) 29.0-29.9, adult: Secondary | ICD-10-CM | POA: Diagnosis not present

## 2019-09-06 DIAGNOSIS — E7800 Pure hypercholesterolemia, unspecified: Secondary | ICD-10-CM | POA: Diagnosis not present

## 2019-09-06 DIAGNOSIS — I10 Essential (primary) hypertension: Secondary | ICD-10-CM | POA: Diagnosis not present

## 2019-09-18 DIAGNOSIS — M545 Low back pain: Secondary | ICD-10-CM | POA: Diagnosis not present

## 2019-09-18 DIAGNOSIS — M25551 Pain in right hip: Secondary | ICD-10-CM | POA: Diagnosis not present

## 2019-09-18 DIAGNOSIS — M1611 Unilateral primary osteoarthritis, right hip: Secondary | ICD-10-CM | POA: Diagnosis not present

## 2019-09-27 DIAGNOSIS — M25551 Pain in right hip: Secondary | ICD-10-CM | POA: Diagnosis not present

## 2019-09-27 DIAGNOSIS — M6281 Muscle weakness (generalized): Secondary | ICD-10-CM | POA: Diagnosis not present

## 2019-09-27 DIAGNOSIS — M25651 Stiffness of right hip, not elsewhere classified: Secondary | ICD-10-CM | POA: Diagnosis not present

## 2019-10-04 DIAGNOSIS — M6281 Muscle weakness (generalized): Secondary | ICD-10-CM | POA: Diagnosis not present

## 2019-10-04 DIAGNOSIS — M25551 Pain in right hip: Secondary | ICD-10-CM | POA: Diagnosis not present

## 2019-10-04 DIAGNOSIS — M25651 Stiffness of right hip, not elsewhere classified: Secondary | ICD-10-CM | POA: Diagnosis not present

## 2019-10-19 DIAGNOSIS — Z79899 Other long term (current) drug therapy: Secondary | ICD-10-CM | POA: Diagnosis not present

## 2019-10-23 DIAGNOSIS — J4520 Mild intermittent asthma, uncomplicated: Secondary | ICD-10-CM | POA: Diagnosis not present

## 2019-10-23 DIAGNOSIS — E7800 Pure hypercholesterolemia, unspecified: Secondary | ICD-10-CM | POA: Diagnosis not present

## 2019-10-23 DIAGNOSIS — E559 Vitamin D deficiency, unspecified: Secondary | ICD-10-CM | POA: Diagnosis not present

## 2019-10-23 DIAGNOSIS — M810 Age-related osteoporosis without current pathological fracture: Secondary | ICD-10-CM | POA: Diagnosis not present

## 2019-10-23 DIAGNOSIS — Z6829 Body mass index (BMI) 29.0-29.9, adult: Secondary | ICD-10-CM | POA: Diagnosis not present

## 2019-10-23 DIAGNOSIS — M1611 Unilateral primary osteoarthritis, right hip: Secondary | ICD-10-CM | POA: Diagnosis not present

## 2019-10-23 DIAGNOSIS — I10 Essential (primary) hypertension: Secondary | ICD-10-CM | POA: Diagnosis not present

## 2019-10-23 DIAGNOSIS — Z01818 Encounter for other preprocedural examination: Secondary | ICD-10-CM | POA: Diagnosis not present

## 2019-10-26 DIAGNOSIS — J4530 Mild persistent asthma, uncomplicated: Secondary | ICD-10-CM | POA: Diagnosis not present

## 2019-10-30 DIAGNOSIS — M1611 Unilateral primary osteoarthritis, right hip: Secondary | ICD-10-CM | POA: Diagnosis not present

## 2019-11-06 ENCOUNTER — Ambulatory Visit: Payer: Medicare Other | Admitting: Cardiology

## 2019-11-06 ENCOUNTER — Encounter: Payer: Self-pay | Admitting: Cardiology

## 2019-11-06 ENCOUNTER — Other Ambulatory Visit: Payer: Self-pay

## 2019-11-06 VITALS — BP 133/59 | HR 57 | Resp 15 | Ht 63.0 in | Wt 171.4 lb

## 2019-11-06 DIAGNOSIS — I700 Atherosclerosis of aorta: Secondary | ICD-10-CM | POA: Diagnosis not present

## 2019-11-06 DIAGNOSIS — I10 Essential (primary) hypertension: Secondary | ICD-10-CM

## 2019-11-06 DIAGNOSIS — R001 Bradycardia, unspecified: Secondary | ICD-10-CM | POA: Diagnosis not present

## 2019-11-06 DIAGNOSIS — Z01818 Encounter for other preprocedural examination: Secondary | ICD-10-CM

## 2019-11-06 NOTE — Progress Notes (Signed)
Primary Physician/Referring:  Melony Overly, MD  Patient ID: Nicole Price, female    DOB: 1950/07/21, 69 y.o.   MRN: [id number]  Chief Complaint  Patient presents with  . Surgical Clearance    Hip replacement   HPI:    Nicole Price  is a 69 y.o. Caucasian female with mild hypertension, hyperlipidemia, reactive airway disease with asthma, family history of aortic aneurysm rupture and her mother. Patient with good baseline functional capacity with no significant medical illnesses, except asthma. She was admitted sometime in 2017 for acute gallbladder stone with severe pancreatitis and during ERCP had an episode of A. fib with no recurrence.  She now presents for preop evaluation, scheduled for hip replacement.  Until recently has been active until degenerative joint disease has gotten worse and has reduced her physical activity.  Denies chest pain or shortness of breath.    Past Medical History:  Diagnosis Date  . Acute pancreatitis   . Aortic aneurysm ([place])   . Asthma   . Benign essential hypertension   . Colon polyps   . Diabetes mellitus without complication ([place])   . GERD (gastroesophageal reflux disease)   . Hypercholesteremia   . Osteoporosis    Past Surgical History:  Procedure Laterality Date  . BACK SURGERY  2004   x2  . CATARACT EXTRACTION, BILATERAL  2019  . CHOLECYSTECTOMY  2019  . PARTIAL HYSTERECTOMY  2016  . TUBAL LIGATION  1977   Family History  Problem Relation Age of Onset  . Heart attack Father   . Arthritis Sister   . Asthma Brother     Social History   Tobacco Use  . Smoking status: Never Smoker  . Smokeless tobacco: Never Used  Substance Use Topics  . Alcohol use: Never   Marital Status: Married  ROS  Review of Systems  Cardiovascular: Positive for dyspnea on exertion. Negative for chest pain and leg swelling.  Respiratory: Positive for wheezing. Negative for sputum production.   Gastrointestinal: Negative for melena.    Objective  Blood pressure (!) 133/59, pulse (!) 57, resp. rate 15, height 5\' 3"  (1.6 m), weight 171 lb 6.4 oz (77.7 kg), SpO2 98 %.  Vitals with BMI 11/06/2019  Height 5\' 3"   Weight 171 lbs 6 oz  BMI 62.69  Systolic 485  Diastolic 59  Pulse 57     Physical Exam Cardiovascular:     Rate and Rhythm: Normal rate and regular rhythm.     Pulses: Intact distal pulses.     Heart sounds: Normal heart sounds. No murmur heard.  No gallop.      Comments: No leg edema, no JVD. Pulmonary:     Effort: Pulmonary effort is normal. No respiratory distress.     Breath sounds: Wheezing present.  Abdominal:     General: Bowel sounds are normal.     Palpations: Abdomen is soft.    Laboratory examination:   External labs:   Cholesterol, total 179.000 m 09/06/2019 HDL 65.000 mg 09/06/2019 LDL-C 100.000 m 09/06/2019 Triglycerides 78.000 mg 09/06/2019  A1C N/D TSH 1.010 05/30/2015  Hemoglobin 15.200 g/d 10/15/2019 INR 1.000 10/19/2019 Platelets 222.000 x1 10/15/2019  Creatinine, Serum 0.910 mg/ 10/15/2019 Potassium 4.000 mm 10/15/2019 Magnesium N/D ALT (SGPT) 20.000 IU/ 09/06/2019   Medications and allergies  Not on File   Current Outpatient Medications  Medication Instructions  . aspirin 81 mg, Oral  . bisoprolol (ZEBETA) 5 mg, Oral, Daily  . Echinacea-Goldenseal (ECHINACEA COMB/GOLDEN SEAL) CAPS 900  capsules, Oral, Daily  . ergocalciferol (VITAMIN D2) 5,000 Units, Oral, Weekly  . Flaxseed Oil (LINSEED OIL) OIL 1,000 mg, Oral, Daily  . Fluticasone-Salmeterol (ADVAIR) 250-50 MCG/DOSE AEPB 50 puffs, Inhalation, 2 times daily  . montelukast (SINGULAIR) 10 mg, Oral, Daily  . pantoprazole (PROTONIX) 40 mg, Oral, Daily  . polyethylene glycol powder (GLYCOLAX/MIRALAX) 17 GM/SCOOP powder Oral  . simvastatin (ZOCOR) 20 mg, Oral, Daily  . VENTOLIN HFA 108 (90 Base) MCG/ACT inhaler No dose, route, or frequency recorded.  . Vitamin D 2,000 Units, Oral, Daily    Radiology:   No results  found.  Cardiac Studies:   Echocardiogram 06/03/2017: Normal LV EF 60-70%. Grade 1 diastolic dysfunction. Mild aortic sclerosis. Mild TR.  Abdominal aortic duplex 07/20/2017: Diffuse plaque noted in the proximal, mid and distal aorta. Small abdominal aortic aneurysm measuring 2.57 x 2.57 x 2.53 cm is seen. Recheck in 3 years.  Event monitor 06/17/2017-07/16/2017: Sinus rhythm and sinus arrhythmia with artifact. No atrial fibrillation, atrial flutter, SVT, high-grade AV block. No symptoms reported  EKG  EKG 11/06/2019: Marked sinus bradycardia at rate of 50 bpm, left axis deviation, poor R progression, probably normal variant.  No evidence of ischemia.  Normal QT interval.   No significant change from 06/17/2017: Sinus bradycardia 53 bpm.  Assessment     ICD-10-CM   1. Pre-operative clearance  Z01.818 EKG 12-Lead  2. Sinus bradycardia by electrocardiogram  R00.1   3. Primary hypertension  I10   4. Aortic atherosclerosis (HCC)  I70.0      There are no discontinued medications.   Recommendations:   Nicole Price  is a 69 y.o. Caucasian female with mild hypertension, hyperlipidemia, reactive airway disease with asthma, family history of aortic aneurysm rupture and her mother. Patient with good baseline functional capacity with no significant medical illnesses, except asthma. She was admitted sometime in 2017 for acute gallbladder stone with severe pancreatitis and during ERCP had an episode of A. fib with no recurrence.  Her physical exam remains unremarkable, blood pressure is normal, she has no significant cardiovascular risks, she can be taken up for the upcoming surgery with low risk from cardiac standpoint.  I reviewed her lipids, she is not on a statin but does have a significant amount of aortic atherosclerosis and would recommend statin therapy which can be performed in the outpatient basis.  I will see her back on a as needed basis.  I reviewed her abdominal aortic  duplex, she does not have aortic aneurysm but does indeed have mild aortic ectasia and aortic atherosclerosis.    Adrian Prows, MD, [facility] 11/07/2019, 1:04 PM Office: [phone]  CC: Frederik Pear, MD; Quintella Reichert, MD

## 2019-11-07 ENCOUNTER — Telehealth: Payer: Self-pay

## 2019-11-07 ENCOUNTER — Encounter: Payer: Self-pay | Admitting: Cardiology

## 2019-11-08 ENCOUNTER — Ambulatory Visit: Payer: Medicare Other | Admitting: Cardiology

## 2019-11-09 DIAGNOSIS — M1611 Unilateral primary osteoarthritis, right hip: Secondary | ICD-10-CM | POA: Diagnosis not present

## 2019-11-12 DIAGNOSIS — M25551 Pain in right hip: Secondary | ICD-10-CM | POA: Diagnosis not present

## 2019-11-12 DIAGNOSIS — M6281 Muscle weakness (generalized): Secondary | ICD-10-CM | POA: Diagnosis not present

## 2019-11-15 DIAGNOSIS — M6281 Muscle weakness (generalized): Secondary | ICD-10-CM | POA: Diagnosis not present

## 2019-11-15 DIAGNOSIS — M25551 Pain in right hip: Secondary | ICD-10-CM | POA: Diagnosis not present

## 2019-11-19 DIAGNOSIS — M25551 Pain in right hip: Secondary | ICD-10-CM | POA: Diagnosis not present

## 2019-11-19 DIAGNOSIS — M6281 Muscle weakness (generalized): Secondary | ICD-10-CM | POA: Diagnosis not present

## 2019-11-20 DIAGNOSIS — M25551 Pain in right hip: Secondary | ICD-10-CM | POA: Diagnosis not present

## 2019-11-20 DIAGNOSIS — Z471 Aftercare following joint replacement surgery: Secondary | ICD-10-CM | POA: Diagnosis not present

## 2019-11-20 DIAGNOSIS — Z96641 Presence of right artificial hip joint: Secondary | ICD-10-CM | POA: Diagnosis not present

## 2019-11-22 DIAGNOSIS — M25551 Pain in right hip: Secondary | ICD-10-CM | POA: Diagnosis not present

## 2019-11-22 DIAGNOSIS — M6281 Muscle weakness (generalized): Secondary | ICD-10-CM | POA: Diagnosis not present

## 2019-11-26 DIAGNOSIS — M6281 Muscle weakness (generalized): Secondary | ICD-10-CM | POA: Diagnosis not present

## 2019-11-26 DIAGNOSIS — M25551 Pain in right hip: Secondary | ICD-10-CM | POA: Diagnosis not present

## 2019-11-29 DIAGNOSIS — M25551 Pain in right hip: Secondary | ICD-10-CM | POA: Diagnosis not present

## 2019-11-29 DIAGNOSIS — M6281 Muscle weakness (generalized): Secondary | ICD-10-CM | POA: Diagnosis not present

## 2019-11-30 ENCOUNTER — Ambulatory Visit: Payer: Medicare Other | Admitting: Cardiology

## 2019-12-03 DIAGNOSIS — M25551 Pain in right hip: Secondary | ICD-10-CM | POA: Diagnosis not present

## 2019-12-03 DIAGNOSIS — M6281 Muscle weakness (generalized): Secondary | ICD-10-CM | POA: Diagnosis not present

## 2019-12-05 DIAGNOSIS — M25551 Pain in right hip: Secondary | ICD-10-CM | POA: Diagnosis not present

## 2019-12-05 DIAGNOSIS — M6281 Muscle weakness (generalized): Secondary | ICD-10-CM | POA: Diagnosis not present

## 2019-12-11 DIAGNOSIS — M6281 Muscle weakness (generalized): Secondary | ICD-10-CM | POA: Diagnosis not present

## 2019-12-11 DIAGNOSIS — M25551 Pain in right hip: Secondary | ICD-10-CM | POA: Diagnosis not present

## 2019-12-13 DIAGNOSIS — M25551 Pain in right hip: Secondary | ICD-10-CM | POA: Diagnosis not present

## 2019-12-13 DIAGNOSIS — M6281 Muscle weakness (generalized): Secondary | ICD-10-CM | POA: Diagnosis not present

## 2019-12-19 DIAGNOSIS — M25551 Pain in right hip: Secondary | ICD-10-CM | POA: Diagnosis not present

## 2019-12-19 DIAGNOSIS — M6281 Muscle weakness (generalized): Secondary | ICD-10-CM | POA: Diagnosis not present

## 2019-12-21 DIAGNOSIS — M810 Age-related osteoporosis without current pathological fracture: Secondary | ICD-10-CM | POA: Diagnosis not present

## 2019-12-24 DIAGNOSIS — M25551 Pain in right hip: Secondary | ICD-10-CM | POA: Diagnosis not present

## 2019-12-24 DIAGNOSIS — M6281 Muscle weakness (generalized): Secondary | ICD-10-CM | POA: Diagnosis not present

## 2020-01-13 DIAGNOSIS — J45901 Unspecified asthma with (acute) exacerbation: Secondary | ICD-10-CM | POA: Diagnosis not present

## 2020-01-16 DIAGNOSIS — J4531 Mild persistent asthma with (acute) exacerbation: Secondary | ICD-10-CM | POA: Diagnosis not present

## 2020-01-18 DIAGNOSIS — J4531 Mild persistent asthma with (acute) exacerbation: Secondary | ICD-10-CM | POA: Diagnosis not present

## 2020-03-03 DIAGNOSIS — R059 Cough, unspecified: Secondary | ICD-10-CM | POA: Diagnosis not present

## 2020-03-03 DIAGNOSIS — R0600 Dyspnea, unspecified: Secondary | ICD-10-CM | POA: Diagnosis not present

## 2020-03-03 DIAGNOSIS — J4531 Mild persistent asthma with (acute) exacerbation: Secondary | ICD-10-CM | POA: Diagnosis not present

## 2020-03-03 DIAGNOSIS — R062 Wheezing: Secondary | ICD-10-CM | POA: Diagnosis not present

## 2020-03-07 DIAGNOSIS — E559 Vitamin D deficiency, unspecified: Secondary | ICD-10-CM | POA: Diagnosis not present

## 2020-03-07 DIAGNOSIS — E7800 Pure hypercholesterolemia, unspecified: Secondary | ICD-10-CM | POA: Diagnosis not present

## 2020-03-07 DIAGNOSIS — Z6830 Body mass index (BMI) 30.0-30.9, adult: Secondary | ICD-10-CM | POA: Diagnosis not present

## 2020-03-07 DIAGNOSIS — Z23 Encounter for immunization: Secondary | ICD-10-CM | POA: Diagnosis not present

## 2020-03-07 DIAGNOSIS — M810 Age-related osteoporosis without current pathological fracture: Secondary | ICD-10-CM | POA: Diagnosis not present

## 2020-03-07 DIAGNOSIS — I10 Essential (primary) hypertension: Secondary | ICD-10-CM | POA: Diagnosis not present

## 2020-04-04 DIAGNOSIS — Z23 Encounter for immunization: Secondary | ICD-10-CM | POA: Diagnosis not present

## 2020-04-15 DIAGNOSIS — J4531 Mild persistent asthma with (acute) exacerbation: Secondary | ICD-10-CM | POA: Diagnosis not present

## 2020-04-15 DIAGNOSIS — J189 Pneumonia, unspecified organism: Secondary | ICD-10-CM | POA: Diagnosis not present

## 2020-04-23 DIAGNOSIS — J4531 Mild persistent asthma with (acute) exacerbation: Secondary | ICD-10-CM | POA: Diagnosis not present

## 2020-05-12 DIAGNOSIS — J4520 Mild intermittent asthma, uncomplicated: Secondary | ICD-10-CM | POA: Diagnosis not present

## 2020-06-23 DIAGNOSIS — M810 Age-related osteoporosis without current pathological fracture: Secondary | ICD-10-CM | POA: Diagnosis not present

## 2020-07-02 DIAGNOSIS — R059 Cough, unspecified: Secondary | ICD-10-CM | POA: Diagnosis not present

## 2020-07-02 DIAGNOSIS — R062 Wheezing: Secondary | ICD-10-CM | POA: Diagnosis not present

## 2020-07-02 DIAGNOSIS — J4520 Mild intermittent asthma, uncomplicated: Secondary | ICD-10-CM | POA: Diagnosis not present

## 2020-07-02 DIAGNOSIS — Z03818 Encounter for observation for suspected exposure to other biological agents ruled out: Secondary | ICD-10-CM | POA: Diagnosis not present

## 2020-07-08 DIAGNOSIS — M810 Age-related osteoporosis without current pathological fracture: Secondary | ICD-10-CM | POA: Diagnosis not present

## 2020-07-16 DIAGNOSIS — J4520 Mild intermittent asthma, uncomplicated: Secondary | ICD-10-CM | POA: Diagnosis not present

## 2020-07-25 DIAGNOSIS — Z23 Encounter for immunization: Secondary | ICD-10-CM | POA: Diagnosis not present

## 2020-09-12 DIAGNOSIS — R051 Acute cough: Secondary | ICD-10-CM | POA: Diagnosis not present

## 2020-09-12 DIAGNOSIS — Z20828 Contact with and (suspected) exposure to other viral communicable diseases: Secondary | ICD-10-CM | POA: Diagnosis not present

## 2020-09-12 DIAGNOSIS — J069 Acute upper respiratory infection, unspecified: Secondary | ICD-10-CM | POA: Diagnosis not present

## 2020-09-15 DIAGNOSIS — M810 Age-related osteoporosis without current pathological fracture: Secondary | ICD-10-CM | POA: Diagnosis not present

## 2020-09-15 DIAGNOSIS — Z6829 Body mass index (BMI) 29.0-29.9, adult: Secondary | ICD-10-CM | POA: Diagnosis not present

## 2020-09-15 DIAGNOSIS — Z139 Encounter for screening, unspecified: Secondary | ICD-10-CM | POA: Diagnosis not present

## 2020-09-15 DIAGNOSIS — K219 Gastro-esophageal reflux disease without esophagitis: Secondary | ICD-10-CM | POA: Diagnosis not present

## 2020-09-15 DIAGNOSIS — E559 Vitamin D deficiency, unspecified: Secondary | ICD-10-CM | POA: Diagnosis not present

## 2020-09-15 DIAGNOSIS — E7800 Pure hypercholesterolemia, unspecified: Secondary | ICD-10-CM | POA: Diagnosis not present

## 2020-09-15 DIAGNOSIS — I10 Essential (primary) hypertension: Secondary | ICD-10-CM | POA: Diagnosis not present

## 2020-09-15 DIAGNOSIS — Z1231 Encounter for screening mammogram for malignant neoplasm of breast: Secondary | ICD-10-CM | POA: Diagnosis not present

## 2020-09-16 DIAGNOSIS — J4531 Mild persistent asthma with (acute) exacerbation: Secondary | ICD-10-CM | POA: Diagnosis not present

## 2020-09-16 DIAGNOSIS — R079 Chest pain, unspecified: Secondary | ICD-10-CM | POA: Diagnosis not present

## 2020-09-16 DIAGNOSIS — R0602 Shortness of breath: Secondary | ICD-10-CM | POA: Diagnosis not present

## 2020-09-16 DIAGNOSIS — Z9049 Acquired absence of other specified parts of digestive tract: Secondary | ICD-10-CM | POA: Diagnosis not present

## 2020-09-16 DIAGNOSIS — Z03818 Encounter for observation for suspected exposure to other biological agents ruled out: Secondary | ICD-10-CM | POA: Diagnosis not present

## 2020-09-16 DIAGNOSIS — I2699 Other pulmonary embolism without acute cor pulmonale: Secondary | ICD-10-CM | POA: Diagnosis not present

## 2020-09-16 DIAGNOSIS — I700 Atherosclerosis of aorta: Secondary | ICD-10-CM | POA: Diagnosis not present

## 2020-09-16 DIAGNOSIS — R0600 Dyspnea, unspecified: Secondary | ICD-10-CM | POA: Diagnosis not present

## 2020-09-22 DIAGNOSIS — R0600 Dyspnea, unspecified: Secondary | ICD-10-CM

## 2020-09-30 DIAGNOSIS — J4530 Mild persistent asthma, uncomplicated: Secondary | ICD-10-CM | POA: Diagnosis not present

## 2020-10-07 DIAGNOSIS — Z1231 Encounter for screening mammogram for malignant neoplasm of breast: Secondary | ICD-10-CM | POA: Diagnosis not present

## 2020-10-16 DIAGNOSIS — Z9181 History of falling: Secondary | ICD-10-CM | POA: Diagnosis not present

## 2020-10-16 DIAGNOSIS — E785 Hyperlipidemia, unspecified: Secondary | ICD-10-CM | POA: Diagnosis not present

## 2020-10-16 DIAGNOSIS — Z0000 Encounter for general adult medical examination without abnormal findings: Secondary | ICD-10-CM | POA: Diagnosis not present

## 2020-10-16 DIAGNOSIS — Z1331 Encounter for screening for depression: Secondary | ICD-10-CM | POA: Diagnosis not present

## 2020-10-27 DIAGNOSIS — H5202 Hypermetropia, left eye: Secondary | ICD-10-CM | POA: Diagnosis not present

## 2020-10-27 DIAGNOSIS — H35371 Puckering of macula, right eye: Secondary | ICD-10-CM | POA: Diagnosis not present

## 2020-10-27 DIAGNOSIS — H26493 Other secondary cataract, bilateral: Secondary | ICD-10-CM | POA: Diagnosis not present

## 2020-10-27 DIAGNOSIS — H5211 Myopia, right eye: Secondary | ICD-10-CM | POA: Diagnosis not present

## 2020-10-27 DIAGNOSIS — Z961 Presence of intraocular lens: Secondary | ICD-10-CM | POA: Diagnosis not present

## 2020-11-24 DIAGNOSIS — H5211 Myopia, right eye: Secondary | ICD-10-CM | POA: Diagnosis not present

## 2020-11-24 DIAGNOSIS — H5202 Hypermetropia, left eye: Secondary | ICD-10-CM | POA: Diagnosis not present

## 2020-11-24 DIAGNOSIS — Z961 Presence of intraocular lens: Secondary | ICD-10-CM | POA: Diagnosis not present

## 2020-11-24 DIAGNOSIS — H35371 Puckering of macula, right eye: Secondary | ICD-10-CM | POA: Diagnosis not present

## 2020-11-24 DIAGNOSIS — H26493 Other secondary cataract, bilateral: Secondary | ICD-10-CM | POA: Diagnosis not present

## 2020-12-12 DIAGNOSIS — Z79899 Other long term (current) drug therapy: Secondary | ICD-10-CM | POA: Diagnosis not present

## 2020-12-22 DIAGNOSIS — J4530 Mild persistent asthma, uncomplicated: Secondary | ICD-10-CM | POA: Diagnosis not present

## 2021-01-10 DIAGNOSIS — J45909 Unspecified asthma, uncomplicated: Secondary | ICD-10-CM | POA: Diagnosis not present

## 2021-01-10 DIAGNOSIS — J029 Acute pharyngitis, unspecified: Secondary | ICD-10-CM | POA: Diagnosis not present

## 2021-01-10 DIAGNOSIS — Z20828 Contact with and (suspected) exposure to other viral communicable diseases: Secondary | ICD-10-CM | POA: Diagnosis not present

## 2021-01-10 DIAGNOSIS — J209 Acute bronchitis, unspecified: Secondary | ICD-10-CM | POA: Diagnosis not present

## 2021-01-10 DIAGNOSIS — R509 Fever, unspecified: Secondary | ICD-10-CM | POA: Diagnosis not present

## 2021-01-10 DIAGNOSIS — J069 Acute upper respiratory infection, unspecified: Secondary | ICD-10-CM | POA: Diagnosis not present

## 2021-01-20 DIAGNOSIS — M810 Age-related osteoporosis without current pathological fracture: Secondary | ICD-10-CM | POA: Diagnosis not present

## 2021-02-24 DIAGNOSIS — R0981 Nasal congestion: Secondary | ICD-10-CM | POA: Diagnosis not present

## 2021-02-24 DIAGNOSIS — Z20828 Contact with and (suspected) exposure to other viral communicable diseases: Secondary | ICD-10-CM | POA: Diagnosis not present

## 2021-02-24 DIAGNOSIS — R051 Acute cough: Secondary | ICD-10-CM | POA: Diagnosis not present

## 2021-02-24 DIAGNOSIS — R509 Fever, unspecified: Secondary | ICD-10-CM | POA: Diagnosis not present

## 2021-03-16 DIAGNOSIS — J4520 Mild intermittent asthma, uncomplicated: Secondary | ICD-10-CM | POA: Diagnosis not present

## 2021-03-16 DIAGNOSIS — I10 Essential (primary) hypertension: Secondary | ICD-10-CM | POA: Diagnosis not present

## 2021-03-16 DIAGNOSIS — E7800 Pure hypercholesterolemia, unspecified: Secondary | ICD-10-CM | POA: Diagnosis not present

## 2021-03-16 DIAGNOSIS — M810 Age-related osteoporosis without current pathological fracture: Secondary | ICD-10-CM | POA: Diagnosis not present

## 2021-03-16 DIAGNOSIS — E559 Vitamin D deficiency, unspecified: Secondary | ICD-10-CM | POA: Diagnosis not present

## 2021-03-16 DIAGNOSIS — L989 Disorder of the skin and subcutaneous tissue, unspecified: Secondary | ICD-10-CM | POA: Diagnosis not present

## 2021-03-16 DIAGNOSIS — Z6830 Body mass index (BMI) 30.0-30.9, adult: Secondary | ICD-10-CM | POA: Diagnosis not present

## 2021-03-23 DIAGNOSIS — H35371 Puckering of macula, right eye: Secondary | ICD-10-CM | POA: Diagnosis not present

## 2021-03-23 DIAGNOSIS — J4530 Mild persistent asthma, uncomplicated: Secondary | ICD-10-CM | POA: Diagnosis not present

## 2021-03-23 DIAGNOSIS — Z961 Presence of intraocular lens: Secondary | ICD-10-CM | POA: Diagnosis not present

## 2021-03-23 DIAGNOSIS — H26493 Other secondary cataract, bilateral: Secondary | ICD-10-CM | POA: Diagnosis not present

## 2021-03-24 DIAGNOSIS — D485 Neoplasm of uncertain behavior of skin: Secondary | ICD-10-CM | POA: Diagnosis not present

## 2021-03-24 DIAGNOSIS — L449 Papulosquamous disorder, unspecified: Secondary | ICD-10-CM | POA: Diagnosis not present

## 2021-04-01 DIAGNOSIS — Z4802 Encounter for removal of sutures: Secondary | ICD-10-CM | POA: Diagnosis not present

## 2021-04-01 DIAGNOSIS — Z6830 Body mass index (BMI) 30.0-30.9, adult: Secondary | ICD-10-CM | POA: Diagnosis not present

## 2021-04-05 HISTORY — PX: EYE SURGERY: SHX253

## 2021-04-10 DIAGNOSIS — H3581 Retinal edema: Secondary | ICD-10-CM | POA: Diagnosis not present

## 2021-04-10 DIAGNOSIS — H43812 Vitreous degeneration, left eye: Secondary | ICD-10-CM | POA: Diagnosis not present

## 2021-04-10 DIAGNOSIS — H35371 Puckering of macula, right eye: Secondary | ICD-10-CM | POA: Diagnosis not present

## 2021-04-23 DIAGNOSIS — H3581 Retinal edema: Secondary | ICD-10-CM | POA: Diagnosis not present

## 2021-04-23 DIAGNOSIS — H35371 Puckering of macula, right eye: Secondary | ICD-10-CM | POA: Diagnosis not present

## 2021-04-24 DIAGNOSIS — H35371 Puckering of macula, right eye: Secondary | ICD-10-CM | POA: Diagnosis not present

## 2021-05-01 DIAGNOSIS — H3581 Retinal edema: Secondary | ICD-10-CM | POA: Diagnosis not present

## 2021-05-01 DIAGNOSIS — H35371 Puckering of macula, right eye: Secondary | ICD-10-CM | POA: Diagnosis not present

## 2021-05-22 DIAGNOSIS — H43812 Vitreous degeneration, left eye: Secondary | ICD-10-CM | POA: Diagnosis not present

## 2021-05-22 DIAGNOSIS — H35371 Puckering of macula, right eye: Secondary | ICD-10-CM | POA: Diagnosis not present

## 2021-05-22 DIAGNOSIS — H3581 Retinal edema: Secondary | ICD-10-CM | POA: Diagnosis not present

## 2021-05-29 DIAGNOSIS — G501 Atypical facial pain: Secondary | ICD-10-CM | POA: Diagnosis not present

## 2021-07-03 DIAGNOSIS — J4530 Mild persistent asthma, uncomplicated: Secondary | ICD-10-CM | POA: Diagnosis not present

## 2021-07-03 DIAGNOSIS — B3789 Other sites of candidiasis: Secondary | ICD-10-CM | POA: Diagnosis not present

## 2021-07-06 DIAGNOSIS — M810 Age-related osteoporosis without current pathological fracture: Secondary | ICD-10-CM | POA: Diagnosis not present

## 2021-07-21 DIAGNOSIS — M810 Age-related osteoporosis without current pathological fracture: Secondary | ICD-10-CM | POA: Diagnosis not present

## 2021-09-14 DIAGNOSIS — I10 Essential (primary) hypertension: Secondary | ICD-10-CM | POA: Diagnosis not present

## 2021-09-14 DIAGNOSIS — J4520 Mild intermittent asthma, uncomplicated: Secondary | ICD-10-CM | POA: Diagnosis not present

## 2021-09-14 DIAGNOSIS — E7800 Pure hypercholesterolemia, unspecified: Secondary | ICD-10-CM | POA: Diagnosis not present

## 2021-09-14 DIAGNOSIS — E559 Vitamin D deficiency, unspecified: Secondary | ICD-10-CM | POA: Diagnosis not present

## 2021-09-14 DIAGNOSIS — M810 Age-related osteoporosis without current pathological fracture: Secondary | ICD-10-CM | POA: Diagnosis not present

## 2021-09-24 DIAGNOSIS — M8589 Other specified disorders of bone density and structure, multiple sites: Secondary | ICD-10-CM | POA: Diagnosis not present

## 2021-09-24 DIAGNOSIS — M810 Age-related osteoporosis without current pathological fracture: Secondary | ICD-10-CM | POA: Diagnosis not present

## 2021-09-25 DIAGNOSIS — J4521 Mild intermittent asthma with (acute) exacerbation: Secondary | ICD-10-CM | POA: Diagnosis not present

## 2021-10-02 DIAGNOSIS — J4520 Mild intermittent asthma, uncomplicated: Secondary | ICD-10-CM | POA: Diagnosis not present

## 2021-10-19 DIAGNOSIS — Z9181 History of falling: Secondary | ICD-10-CM | POA: Diagnosis not present

## 2021-10-19 DIAGNOSIS — E669 Obesity, unspecified: Secondary | ICD-10-CM | POA: Diagnosis not present

## 2021-10-19 DIAGNOSIS — Z0000 Encounter for general adult medical examination without abnormal findings: Secondary | ICD-10-CM | POA: Diagnosis not present

## 2021-10-19 DIAGNOSIS — Z6830 Body mass index (BMI) 30.0-30.9, adult: Secondary | ICD-10-CM | POA: Diagnosis not present

## 2021-10-19 DIAGNOSIS — Z139 Encounter for screening, unspecified: Secondary | ICD-10-CM | POA: Diagnosis not present

## 2021-10-19 DIAGNOSIS — E785 Hyperlipidemia, unspecified: Secondary | ICD-10-CM | POA: Diagnosis not present

## 2021-10-19 DIAGNOSIS — Z1331 Encounter for screening for depression: Secondary | ICD-10-CM | POA: Diagnosis not present

## 2021-11-06 ENCOUNTER — Encounter: Payer: Self-pay | Admitting: Cardiology

## 2021-11-06 ENCOUNTER — Ambulatory Visit: Payer: Medicare Other | Admitting: Cardiology

## 2021-11-06 VITALS — BP 126/87 | HR 72 | Temp 97.2°F | Resp 16 | Ht 63.0 in | Wt 172.6 lb

## 2021-11-06 DIAGNOSIS — R0602 Shortness of breath: Secondary | ICD-10-CM | POA: Diagnosis not present

## 2021-11-06 DIAGNOSIS — R0789 Other chest pain: Secondary | ICD-10-CM

## 2021-11-06 DIAGNOSIS — J4540 Moderate persistent asthma, uncomplicated: Secondary | ICD-10-CM

## 2021-11-06 NOTE — Progress Notes (Signed)
Primary Physician/Referring:  Melony Overly, MD  Patient ID: Nicole Price, female    DOB: 1950-10-12, 71 y.o.   MRN: [id number]  No chief complaint on file.  HPI:    Nicole Price  is a 71 y.o. Caucasian female with mild hypertension, hyperlipidemia, reactive airway disease with asthma, family history of aortic aneurysm rupture and her mother. Patient with good baseline functional capacity with no significant medical illnesses, except asthma. She was admitted sometime in 2017 for acute gallbladder stone with severe pancreatitis and during ERCP had an episode of A. fib with no recurrence.  I had seen her more than 3 years ago, patient made an appointment to see me as she had severe episode of chest pain about 5 days ago, described as sharp pain in the left side of the chest next to the breastbone.  Lasted about 15 minutes and was very sharp and harder to get a breath.  She has felt well since then and has not had any recurrent episodes.  Dyspnea has remained stable.  No leg edema, no hemoptysis.  No dizziness or syncope. Past Medical History:  Diagnosis Date   Acute pancreatitis    Aortic aneurysm (HCC)    Asthma    Benign essential hypertension    Colon polyps    Diabetes mellitus without complication ([place])    GERD (gastroesophageal reflux disease)    Hypercholesteremia    Osteoporosis    Past Surgical History:  Procedure Laterality Date   BACK SURGERY  2004   x2   CATARACT EXTRACTION, BILATERAL  2019   CHOLECYSTECTOMY  2019   PARTIAL HYSTERECTOMY  2016   TUBAL LIGATION  1977   Family History  Problem Relation Age of Onset   Heart attack Father    Arthritis Sister    Asthma Brother     Social History   Tobacco Use   Smoking status: Never   Smokeless tobacco: Never  Substance Use Topics   Alcohol use: Never   Marital Status: Married  ROS  Review of Systems  Cardiovascular:  Positive for chest pain and dyspnea on exertion. Negative for leg swelling.   Respiratory:  Positive for wheezing. Negative for sputum production.   Gastrointestinal:  Negative for melena.   Objective  Blood pressure 126/87, pulse 72, temperature (!) 97.2 F (36.2 C), temperature source Temporal, resp. rate 16, height '5\' 3"'$  (1.6 m), weight 172 lb 9.6 oz (78.3 kg), SpO2 95 %.     11/06/2021   72:32 PM 11/06/2019   12:11 PM  Vitals with BMI  Height '5\' 3"'$  '5\' 3"'$   Weight 172 lbs 10 oz 171 lbs 6 oz  BMI 00.93 81.82  Systolic 993 716  Diastolic 87 59  Pulse 72 57     Physical Exam Neck:     Vascular: No carotid bruit or JVD.  Cardiovascular:     Rate and Rhythm: Normal rate and regular rhythm.     Pulses: Normal pulses and intact distal pulses.     Heart sounds: Normal heart sounds. No murmur heard.    No gallop.  Pulmonary:     Effort: Pulmonary effort is normal. No respiratory distress.     Breath sounds: Wheezing (mild diffuse bilateral) present.  Abdominal:     General: Bowel sounds are normal.     Palpations: Abdomen is soft.  Musculoskeletal:     Right lower leg: No edema.     Left lower leg: No edema.    Laboratory  examination:   External labs:   Cholesterol, total 165.000 m 03/16/2021 HDL 52.000 mg 03/16/2021 LDL 95.000 mg 03/16/2021 Triglycerides 99.000 mg 03/16/2021  Hemoglobin 14.700 g/d 03/16/2021 Platelets 212.000 x1 03/16/2021  Creatinine, Serum 1.000 mg/ 07/06/2021 Potassium 4.000 mm 07/06/2021 ALT (SGPT) 16.000 IU/ 07/06/2021  Medications and allergies  No Known Allergies   Current Outpatient Medications:    aspirin 81 MG EC tablet, Take 81 mg by mouth., Disp: , Rfl:    bisoprolol (ZEBETA) 5 MG tablet, Take 5 mg by mouth daily., Disp: , Rfl:    Cholecalciferol (VITAMIN D) 50 MCG (2000 UT) CAPS, Take 2,000 Units by mouth daily., Disp: , Rfl:    denosumab (PROLIA) 60 MG/ML SOSY injection, Inject 60 mg into the skin every 6 (six) months., Disp: , Rfl:    Flaxseed Oil (LINSEED OIL) OIL, Take 1,000 mg by mouth daily., Disp: , Rfl:     Fluticasone-Salmeterol (ADVAIR) 250-50 MCG/DOSE AEPB, Inhale 50 puffs into the lungs in the morning and at bedtime., Disp: , Rfl:    montelukast (SINGULAIR) 10 MG tablet, Take 10 mg by mouth daily., Disp: , Rfl:    pantoprazole (PROTONIX) 40 MG tablet, Take 40 mg by mouth daily., Disp: , Rfl:    polyethylene glycol powder (GLYCOLAX/MIRALAX) 17 GM/SCOOP powder, Take by mouth., Disp: , Rfl:    simvastatin (ZOCOR) 20 MG tablet, Take 20 mg by mouth daily., Disp: , Rfl:    VENTOLIN HFA 108 (90 Base) MCG/ACT inhaler, , Disp: , Rfl:     Radiology:   No results found.  Cardiac Studies:   Abdominal aortic duplex 07/20/2017: Diffuse plaque noted in the proximal, mid and distal aorta.  Small abdominal aortic aneurysm measuring 2.57 x 2.57 x 2.53 cm is seen.  Recheck in 3 years.  Event monitor 06/17/2017-07/16/2017: Sinus rhythm and sinus arrhythmia with artifact.  No atrial fibrillation, atrial flutter, SVT, high-grade AV block. No symptoms reported  Echocardiogram [place]) 3/38/2505: LV systolic function 65 to 39%. No significant valvular abnormality. Aortic root and ascending aorta appears normal.  No significant change from 06/03/2017  EKG  EKG 11/06/2021: Normal sinus rhythm at rate of 63 bpm, left axis deviation, left anterior fascicular block.  Incomplete right bundle branch block.  No significant change from 11/06/2019.  Assessment     ICD-10-CM   1. Chest pain of uncertain etiology  J67.3 EKG 12-Lead    2. Shortness of breath  R06.02        Medications Discontinued During This Encounter  Medication Reason   ergocalciferol (VITAMIN D2) 1.25 MG (50000 UT) capsule    Echinacea-Goldenseal (ECHINACEA COMB/GOLDEN SEAL) CAPS      Recommendations:   Nicole Price  is a 71 y.o. Caucasian female with mild hypertension, hyperlipidemia, reactive airway disease with asthma, family history of aortic aneurysm rupture and her mother. Patient with good baseline functional capacity with no  significant medical illnesses, except asthma. She was admitted sometime in 2017 for acute gallbladder stone with severe pancreatitis and during ERCP had an episode of A. fib with no recurrence.  I had seen her more than 3 years ago, patient made an appointment to see me as she had severe episode of chest pain about 5 days ago, described as sharp pain in the left side of the chest next to the breastbone.  Lasted about 15 minutes and was very sharp and harder to get a breath.  She has felt well since then and has not had any recurrent episodes.  She  has reproducible chest pain, which is mildly tender costochondral junction bilaterally, EKG is completely normal.  She has not had any recurrence of the chest pain.  Do not suspect angina pectoris.  She has had abdominal aortic duplex that did not reveal any significant abdominal attic aneurysm.  She does not need any screening.  We could consider this in 10 years.  Lipids are well controlled.  She is on a statin.  For abdominal aortic atherosclerosis she is also on aspirin.  Continue the same.  Unless she has recurrence of chest discomfort with exertional activity I will see her back on a as needed basis.  Dyspnea has remained stable related to reactive airway disease.  No recent exacerbation.    Adrian Prows, MD, [facility] 11/06/2021, 12:57 PM Office: [phone]

## 2021-11-10 DIAGNOSIS — Z1231 Encounter for screening mammogram for malignant neoplasm of breast: Secondary | ICD-10-CM | POA: Diagnosis not present

## 2021-11-20 DIAGNOSIS — L2089 Other atopic dermatitis: Secondary | ICD-10-CM | POA: Diagnosis not present

## 2021-11-20 DIAGNOSIS — L0390 Cellulitis, unspecified: Secondary | ICD-10-CM | POA: Diagnosis not present

## 2021-12-23 DIAGNOSIS — J4520 Mild intermittent asthma, uncomplicated: Secondary | ICD-10-CM | POA: Diagnosis not present

## 2021-12-29 DIAGNOSIS — E7800 Pure hypercholesterolemia, unspecified: Secondary | ICD-10-CM | POA: Diagnosis not present

## 2021-12-29 DIAGNOSIS — M810 Age-related osteoporosis without current pathological fracture: Secondary | ICD-10-CM | POA: Diagnosis not present

## 2022-01-14 DIAGNOSIS — I10 Essential (primary) hypertension: Secondary | ICD-10-CM | POA: Diagnosis not present

## 2022-01-14 DIAGNOSIS — Z23 Encounter for immunization: Secondary | ICD-10-CM | POA: Diagnosis not present

## 2022-01-14 DIAGNOSIS — E559 Vitamin D deficiency, unspecified: Secondary | ICD-10-CM | POA: Diagnosis not present

## 2022-01-14 DIAGNOSIS — M810 Age-related osteoporosis without current pathological fracture: Secondary | ICD-10-CM | POA: Diagnosis not present

## 2022-01-22 DIAGNOSIS — M810 Age-related osteoporosis without current pathological fracture: Secondary | ICD-10-CM | POA: Diagnosis not present

## 2022-02-05 DIAGNOSIS — J45901 Unspecified asthma with (acute) exacerbation: Secondary | ICD-10-CM | POA: Diagnosis not present

## 2022-02-12 DIAGNOSIS — J45901 Unspecified asthma with (acute) exacerbation: Secondary | ICD-10-CM | POA: Diagnosis not present

## 2023-05-02 ENCOUNTER — Other Ambulatory Visit: Payer: Self-pay | Admitting: Orthopedic Surgery

## 2023-05-12 ENCOUNTER — Telehealth: Payer: Self-pay | Admitting: *Deleted

## 2023-05-12 NOTE — Telephone Encounter (Signed)
   Name: Nicole Price  DOB: 05-Dec-1950  MRN: [id number]  Primary Cardiologist: None  Chart reviewed as part of pre-operative protocol coverage. Because of LAKINDRA WIBLE past medical history and time since last visit, she will require a follow-up in-office visit in order to better assess preoperative cardiovascular risk.  Patient has not been seen in clinic since 11/2021.  Pre-op covering staff: - Please schedule appointment and call patient to inform them. If patient already had an upcoming appointment within acceptable timeframe, please add pre-op clearance to the appointment notes so provider is aware. - Please contact requesting surgeon's office via preferred method (i.e, phone, fax) to inform them of need for appointment prior to surgery.  Geanine Vandekamp D Myrka Sylva, NP  05/12/2023, 3:47 PM

## 2023-05-12 NOTE — Pre-Procedure Instructions (Signed)
 Surgical Instructions   Your procedure is scheduled on Wednesday, February 12th. Report to [facility] Main Entrance A at 09:10 A.M., then check in with the Admitting office. Any questions or running late day of surgery: call [phone]  Questions prior to your surgery date: call [phone], Monday-Friday, 8am-4pm. If you experience any cold or flu symptoms such as cough, fever, chills, shortness of breath, etc. between now and your scheduled surgery, please notify us  at the above number.     Remember:  Do not eat after midnight the night before your surgery  You may drink clear liquids until 09:10 AM the morning of your surgery.   Clear liquids allowed are: Water, Non-Citrus Juices (without pulp), Carbonated Beverages, Clear Tea (no milk, honey, etc.), Black Coffee Only (NO MILK, CREAM OR POWDERED CREAMER of any kind), and Gatorade.  Patient Instructions  The night before surgery:  No food after midnight. ONLY clear liquids after midnight  The day of surgery (if you do NOT have diabetes):  Drink ONE (1) Pre-Surgery Clear Ensure by 09:10 AM the morning of surgery. Drink in one sitting. Do not sip.  This drink was given to you during your hospital  pre-op appointment visit.  Nothing else to drink after completing the  Pre-Surgery Clear Ensure.          If you have questions, please contact your surgeon's office.    Take these medicines the morning of surgery with A SIP OF WATER  bisoprolol  (ZEBETA )  montelukast (SINGULAIR)  simvastatin (ZOCOR)     May take these medicines IF NEEDED: albuterol  (PROVENTIL ) nebulizer pantoprazole (PROTONIX)  VENTOLIN  HFA- bring inhaler with you    Follow your surgeon's instructions on when to stop Aspirin.  If no instructions were given by your surgeon then you will need to call the office to get those instructions.    One week prior to surgery, STOP taking any Aleve, Naproxen, Ibuprofen, Motrin, Advil, Goody's, BC's, all herbal  medications, fish oil, and non-prescription vitamins.                     Do NOT Smoke (Tobacco/Vaping) for 24 hours prior to your procedure.  If you use a CPAP at night, you may bring your mask/headgear for your overnight stay.   You will be asked to remove any contacts, glasses, piercing's, hearing aid's, dentures/partials prior to surgery. Please bring cases for these items if needed.    Patients discharged the day of surgery will not be allowed to drive home, and someone needs to stay with them for 24 hours.  SURGICAL WAITING ROOM VISITATION Patients may have no more than 2 support people in the waiting area - these visitors may rotate.   Pre-op nurse will coordinate an appropriate time for 1 ADULT support person, who may not rotate, to accompany patient in pre-op.  Children under the age of 51 must have an adult with them who is not the patient and must remain in the main waiting area with an adult.  If the patient needs to stay at the hospital during part of their recovery, the visitor guidelines for inpatient rooms apply.  Please refer to the [facility] website for the visitor guidelines for any additional information.   If you received a COVID test during your pre-op visit  it is requested that you wear a mask when out in public, stay away from anyone that may not be feeling well and notify your surgeon if you develop symptoms. If  you have been in contact with anyone that has tested positive in the last 10 days please notify you surgeon.      Pre-operative 5 CHG Bathing Instructions   You can play a key role in reducing the risk of infection after surgery. Your skin needs to be as free of germs as possible. You can reduce the number of germs on your skin by washing with CHG (chlorhexidine  gluconate) soap before surgery. CHG is an antiseptic soap that kills germs and continues to kill germs even after washing.   DO NOT use if you have an allergy to chlorhexidine /CHG or  antibacterial soaps. If your skin becomes reddened or irritated, stop using the CHG and notify one of our RNs at [phone].   Please shower with the CHG soap starting 4 days before surgery using the following schedule:     Please keep in mind the following:  DO NOT shave, including legs and underarms, starting the day of your first shower.   You may shave your face at any point before/day of surgery.  Place clean sheets on your bed the day you start using CHG soap. Use a clean washcloth (not used since being washed) for each shower. DO NOT sleep with pets once you start using the CHG.   CHG Shower Instructions:  Wash your face and private area with normal soap. If you choose to wash your hair, wash first with your normal shampoo.  After you use shampoo/soap, rinse your hair and body thoroughly to remove shampoo/soap residue.  Turn the water OFF and apply about 3 tablespoons (45 ml) of CHG soap to a CLEAN washcloth.  Apply CHG soap ONLY FROM YOUR NECK DOWN TO YOUR TOES (washing for 3-5 minutes)  DO NOT use CHG soap on face, private areas, open wounds, or sores.  Pay special attention to the area where your surgery is being performed.  If you are having back surgery, having someone wash your back for you may be helpful. Wait 2 minutes after CHG soap is applied, then you may rinse off the CHG soap.  Pat dry with a clean towel  Put on clean clothes/pajamas   If you choose to wear lotion, please use ONLY the CHG-compatible lotions that are listed below.  Additional instructions for the day of surgery: DO NOT APPLY any lotions, deodorants, cologne, or perfumes.   Do not bring valuables to the hospital. [facility] is not responsible for any belongings/valuables. Do not wear nail polish, gel polish, artificial nails, or any other type of covering on natural nails (fingers and toes) Do not wear jewelry or makeup Put on clean/comfortable clothes.  Please brush your teeth.  Ask your nurse  before applying any prescription medications to the skin.     CHG Compatible Lotions   Aveeno Moisturizing lotion  Cetaphil Moisturizing Cream  Cetaphil Moisturizing Lotion  Clairol Herbal Essence Moisturizing Lotion, Dry Skin  Clairol Herbal Essence Moisturizing Lotion, Extra Dry Skin  Clairol Herbal Essence Moisturizing Lotion, Normal Skin  Curel Age Defying Therapeutic Moisturizing Lotion with Alpha Hydroxy  Curel Extreme Care Body Lotion  Curel Soothing Hands Moisturizing Hand Lotion  Curel Therapeutic Moisturizing Cream, Fragrance-Free  Curel Therapeutic Moisturizing Lotion, Fragrance-Free  Curel Therapeutic Moisturizing Lotion, Original Formula  Eucerin Daily Replenishing Lotion  Eucerin Dry Skin Therapy Plus Alpha Hydroxy Crme  Eucerin Dry Skin Therapy Plus Alpha Hydroxy Lotion  Eucerin Original Crme  Eucerin Original Lotion  Eucerin Plus Crme Eucerin Plus Lotion  Eucerin TriLipid Replenishing Lotion  Keri Anti-Bacterial Hand Lotion  Keri Deep Conditioning Original Lotion Dry Skin Formula Softly Scented  Keri Deep Conditioning Original Lotion, Fragrance Free Sensitive Skin Formula  Keri Lotion Fast Absorbing Fragrance Free Sensitive Skin Formula  Keri Lotion Fast Absorbing Softly Scented Dry Skin Formula  Keri Original Lotion  Keri Skin Renewal Lotion Keri Silky Smooth Lotion  Keri Silky Smooth Sensitive Skin Lotion  Nivea Body Creamy Conditioning Oil  Nivea Body Extra Enriched Lotion  Nivea Body Original Lotion  Nivea Body Sheer Moisturizing Lotion Nivea Crme  Nivea Skin Firming Lotion  NutraDerm 30 Skin Lotion  NutraDerm Skin Lotion  NutraDerm Therapeutic Skin Cream  NutraDerm Therapeutic Skin Lotion  ProShield Protective Hand Cream  Provon moisturizing lotion  Please read over the following fact sheets that you were given.

## 2023-05-12 NOTE — Telephone Encounter (Signed)
   [facility] Pre-operative Risk Assessment    Patient Name: Nicole Price  DOB: 1950/09/20 MRN: [id number]  HEARTCARE STAFF:  - IMPORTANT!!!!!! Under Visit Info/Reason for Call, type in Other and utilize the format Clearance MM/DD/YY or Clearance TBD. Do not use dashes or single digits. - Please review there is not already an duplicate clearance open for this procedure. - If request is for dental extraction, please clarify the # of teeth to be extracted. - If the patient is currently at the dentist's office, call Pre-Op Callback Staff (MA/nurse) to input urgent request.  - If the patient is not currently in the dentist office, please route to the Pre-Op pool.  Request for surgical clearance:  What type of surgery is being performed? Thoracic 11, thoracic 12, kyphoplasty  When is this surgery scheduled? 05/18/23  What type of clearance is required (medical clearance vs. Pharmacy clearance to hold med vs. Both)? Both  Are there any medications that need to be held prior to surgery and how long? Asa 25  Practice name and name of physician performing surgery? [facility], Dr Beuford  What is the office phone number? [phone]   7.   What is the office fax number? [phone] Nicole Price  8.   Anesthesia type (None, local, MAC, general) ? Not listed   Nicole Price 05/12/2023, 12:07 PM  _________________________________________________________________   (provider comments below)

## 2023-05-12 NOTE — Telephone Encounter (Signed)
 I s/w the pt and she is agreeable to appt in office as she was last seen 2023. Pt has been scheduled to see Reche Finder, NP 05/17/23 @ 8:50 @ [facility] location. Pt is agreeable to appt location and has been given the address. In regard to ASA pt states she has been off ASA since 05/10/23 for the procedure. Pt asked if she will still be able to have her procedure 05/18/23. I stated I cannot answer that , that will need to come from the provider she see's.   I will update the surgeon office pt has appt 05/17/23, though will not know f pt cleared until appt is completed.

## 2023-05-12 NOTE — Telephone Encounter (Signed)
 I called the requesting office to s/w the surgery scheduler to inform their office that the pt is going to need an appt in office for preop clearance. I have reviewed [facility] and [facility] for an appt though none in time for clearance for upcoming procedure.   Left message for Arland, surgery scheduler that the pt is going to need in office appt and we have no openings in time for her procedure. I will fax notes as FYI to Arland. We will reach out to the pt with the 1st available appt.

## 2023-05-13 ENCOUNTER — Encounter: Payer: Medicare Other | Admitting: Cardiology

## 2023-05-13 ENCOUNTER — Other Ambulatory Visit: Payer: Self-pay

## 2023-05-13 ENCOUNTER — Encounter (HOSPITAL_COMMUNITY): Payer: Self-pay | Admitting: *Deleted

## 2023-05-13 ENCOUNTER — Encounter: Payer: Self-pay | Admitting: Cardiology

## 2023-05-13 ENCOUNTER — Encounter (HOSPITAL_COMMUNITY)
Admission: RE | Admit: 2023-05-13 | Discharge: 2023-05-13 | Disposition: A | Discharge status: Home / Self Care | Payer: Medicare Other | Source: Ambulatory Visit | Attending: Orthopedic Surgery

## 2023-05-13 VITALS — BP 113/59 | HR 45 | Temp 97.8°F | Resp 18 | Ht 63.0 in | Wt 162.2 lb

## 2023-05-13 VITALS — BP 106/64 | HR 55 | Resp 16 | Ht 63.0 in | Wt 162.0 lb

## 2023-05-13 DIAGNOSIS — Z01810 Encounter for preprocedural cardiovascular examination: Secondary | ICD-10-CM

## 2023-05-13 DIAGNOSIS — Z01818 Encounter for other preprocedural examination: Secondary | ICD-10-CM

## 2023-05-13 DIAGNOSIS — I10 Essential (primary) hypertension: Secondary | ICD-10-CM

## 2023-05-13 DIAGNOSIS — Z79899 Other long term (current) drug therapy: Secondary | ICD-10-CM | POA: Insufficient documentation

## 2023-05-13 DIAGNOSIS — Z01812 Encounter for preprocedural laboratory examination: Secondary | ICD-10-CM | POA: Diagnosis present

## 2023-05-13 DIAGNOSIS — R001 Bradycardia, unspecified: Secondary | ICD-10-CM

## 2023-05-13 DIAGNOSIS — J4540 Moderate persistent asthma, uncomplicated: Secondary | ICD-10-CM

## 2023-05-13 DIAGNOSIS — M4856XA Collapsed vertebra, not elsewhere classified, lumbar region, initial encounter for fracture: Secondary | ICD-10-CM | POA: Diagnosis not present

## 2023-05-13 DIAGNOSIS — R0789 Other chest pain: Secondary | ICD-10-CM

## 2023-05-13 DIAGNOSIS — J45909 Unspecified asthma, uncomplicated: Secondary | ICD-10-CM | POA: Diagnosis not present

## 2023-05-13 DIAGNOSIS — R000 Tachycardia, unspecified: Secondary | ICD-10-CM | POA: Diagnosis not present

## 2023-05-13 DIAGNOSIS — K219 Gastro-esophageal reflux disease without esophagitis: Secondary | ICD-10-CM | POA: Insufficient documentation

## 2023-05-13 DIAGNOSIS — R0602 Shortness of breath: Secondary | ICD-10-CM | POA: Diagnosis not present

## 2023-05-13 DIAGNOSIS — M4854XA Collapsed vertebra, not elsewhere classified, thoracic region, initial encounter for fracture: Secondary | ICD-10-CM | POA: Insufficient documentation

## 2023-05-13 DIAGNOSIS — G8929 Other chronic pain: Secondary | ICD-10-CM | POA: Diagnosis not present

## 2023-05-13 DIAGNOSIS — M545 Low back pain, unspecified: Secondary | ICD-10-CM | POA: Diagnosis not present

## 2023-05-13 HISTORY — DX: Other specified postprocedural states: Z98.890

## 2023-05-13 HISTORY — DX: Acute pancreatitis without necrosis or infection, unspecified: K85.90

## 2023-05-13 LAB — BASIC METABOLIC PANEL
Anion gap: 8 (ref 5–15)
BUN: 14 mg/dL (ref 8–23)
CO2: 23 mmol/L (ref 22–32)
Calcium: 9.9 mg/dL (ref 8.9–10.3)
Chloride: 109 mmol/L (ref 98–111)
Creatinine, Ser: 0.98 mg/dL (ref 0.44–1.00)
GFR, Estimated: >60 mL/min (ref >60)
Glucose, Bld: 93 mg/dL (ref 70–99)
Potassium: 4 mmol/L (ref 3.5–5.1)
Sodium: 140 mmol/L (ref 135–145)

## 2023-05-13 LAB — CBC
HCT: 47.2 % — ABNORMAL HIGH (ref 36.0–46.0)
Hemoglobin: 15.1 g/dL — ABNORMAL HIGH (ref 12.0–15.0)
MCH: 29.8 pg (ref 26.0–34.0)
MCHC: 32 g/dL (ref 30.0–36.0)
MCV: 93.3 fL (ref 80.0–100.0)
Platelets: 239 10*3/uL (ref 150–400)
RBC: 5.06 MIL/uL (ref 3.87–5.11)
RDW: 13.9 % (ref 11.5–15.5)
WBC: 8.6 10*3/uL (ref 4.0–10.5)
nRBC: 0 % (ref 0.0–0.2)

## 2023-05-13 LAB — SURGICAL PCR SCREEN
MRSA, PCR: NEGATIVE
Staphylococcus aureus: POSITIVE — AB

## 2023-05-13 MED ORDER — BISOPROLOL FUMARATE 5 MG PO TABS: 2.5000 mg | ORAL_TABLET | Freq: Every day | ORAL | Status: AC

## 2023-05-13 NOTE — Progress Notes (Signed)
 Cardiology Office Note:  .   Date:  05/14/2023  ID:  Nicole Price, DOB Apr 17, 1950, MRN [id number] PCP: Debby Laveda NOVAK, MD  [facility] Providers Cardiologist:  Gordy Bergamo, MD   History of Present Illness: .   Nicole Price is a 73 y.o. Caucasian female with mild hypertension, hyperlipidemia, reactive airway disease with asthma, family history of aortic aneurysm rupture and her mother. Patient with good baseline functional capacity with no significant medical illnesses, except asthma. She was admitted sometime in 2017 for acute gallbladder stone with severe pancreatitis and during ERCP had an episode of A. fib with no recurrence.  She is now scheduled for thoracolumbar kyphoplasty by Dr. Beuford and presents for preoperative cardiac workup.  Discussed the use of AI scribe software for clinical note transcription with the patient, who gave verbal consent to proceed.  History of Present Illness   Nicole Price is a 73 year old female with bradycardia who presents for preoperative cardiac evaluation. She is accompanied by her husband, who is in the waiting room.  She has a history of bradycardia, with a recent heart rate recorded at 45 beats per minute during a hospital visit for an EKG. No symptoms such as fainting or dizziness are associated with the bradycardia. Her current medication regimen includes bisoprolol , initially prescribed for heart racing symptoms and blood pressure control. Due to the low heart rate, she has reduced the dose. She has also stopped taking aspirin as of May 10, 2023, in preparation for her upcoming surgery. No chest pain, fainting, or dizziness is reported.  She is scheduled for thoracolumbar kyphoplasty due to severe back pain, which began on March 22, 2023, after bending over to place presents under a Christmas tree. Since then, she has been largely sedentary, unable to walk or engage in physical activities as she did before the incident. She  currently uses a back brace to manage the pain.  She has a history of asthma, which is currently well-controlled with no reported exacerbations or symptoms.       Labs   Review of Systems  Cardiovascular:  Positive for dyspnea on exertion (chronic and stable). Negative for chest pain and leg swelling.    Physical Exam:   VS:  BP 106/64 (BP Location: Left Arm, Patient Position: Sitting, Cuff Size: Normal)   Pulse (!) 55   Resp 16   Ht 5' 3 (1.6 m)   Wt 162 lb (73.5 kg)   SpO2 97%   BMI 28.70 kg/m    Wt Readings from Last 3 Encounters:  05/13/23 162 lb 3.2 oz (73.6 kg)  05/13/23 162 lb (73.5 kg)  11/06/21 172 lb 9.6 oz (78.3 kg)    Physical Exam Neck:     Vascular: No carotid bruit or JVD.  Cardiovascular:     Rate and Rhythm: Regular rhythm. Bradycardia present.     Pulses: Intact distal pulses.     Heart sounds: Normal heart sounds. No murmur heard.    No gallop.  Pulmonary:     Effort: Pulmonary effort is normal.     Breath sounds: Normal breath sounds.  Abdominal:     General: Bowel sounds are normal.     Palpations: Abdomen is soft.  Musculoskeletal:     Right lower leg: No edema.     Left lower leg: No edema.    Lab Results  Component Value Date   NA 140 05/13/2023   K 4.0 05/13/2023   CO2 23 05/13/2023  GLUCOSE 93 05/13/2023   BUN 14 05/13/2023   CREATININE 0.98 05/13/2023   CALCIUM 9.9 05/13/2023   GFRNONAA >60 05/13/2023    Lab Results  Component Value Date   WBC 8.6 05/13/2023   HGB 15.1 (H) 05/13/2023   HCT 47.2 (H) 05/13/2023   MCV 93.3 05/13/2023   PLT 239 05/13/2023    Studies Reviewed: SABRA     Event monitor 06/17/2017-07/16/2017: Sinus rhythm and sinus arrhythmia with artifact.  No atrial fibrillation, atrial flutter, SVT, high-grade AV block. No symptoms reported   Echocardiogram Mazie) 09/22/2020: LV systolic function 65 to 70%. No significant valvular abnormality. Aortic root and ascending aorta appears normal.  No significant  change from 06/03/2017 EKG:    EKG 05/13/2023: Marked sinus bradycardia at the rate of 43 bpm, left anterior fascicular block.  Minimal criteria for LVH in aVL could be a normal variant.  Compared to EKG 11/06/2021, no significant change, previous HR was 63/min.    No significant change from 09/17/2010 as well, previous HR was 93/min.   Medications and allergies    No Known Allergies   Current Outpatient Medications:    albuterol  (PROVENTIL ) (2.5 MG/3ML) 0.083% nebulizer solution, Take 2.5 mg by nebulization every 6 (six) hours as needed for wheezing or shortness of breath., Disp: , Rfl:    aspirin 81 MG EC tablet, Take 81 mg by mouth daily., Disp: , Rfl:    bisoprolol  (ZEBETA ) 5 MG tablet, Take 0.5 tablets (2.5 mg total) by mouth daily., Disp: , Rfl:    Cholecalciferol (VITAMIN D) 50 MCG (2000 UT) CAPS, Take 2,000 Units by mouth daily., Disp: , Rfl:    Flaxseed Oil (LINSEED OIL) OIL, Take 1,000 mg by mouth daily., Disp: , Rfl:    montelukast (SINGULAIR) 10 MG tablet, Take 10 mg by mouth daily., Disp: , Rfl:    pantoprazole (PROTONIX) 40 MG tablet, Take 40 mg by mouth daily as needed (acid reflux)., Disp: , Rfl:    simvastatin (ZOCOR) 20 MG tablet, Take 20 mg by mouth daily., Disp: , Rfl:    VENTOLIN  HFA 108 (90 Base) MCG/ACT inhaler, Inhale 1-2 puffs into the lungs every 6 (six) hours as needed for wheezing or shortness of breath., Disp: , Rfl:    ASSESSMENT AND PLAN: .      ICD-10-CM   1. Pre-operative cardiovascular examination  Z01.810     2. Shortness of breath  R06.02     3. Primary hypertension  I10 bisoprolol  (ZEBETA ) 5 MG tablet    4. Bradycardia by electrocardiogram  R00.1 bisoprolol  (ZEBETA ) 5 MG tablet     Assessment and Plan    Bradycardia Marked bradycardia with a heart rate of 45 bpm, asymptomatic. Likely due to Zebeta  (bisoprolol ) prescribed for hypertension and tachycardia. Discussed reducing  to 2.5 mg to manage bradycardia while maintaining blood pressure control. If  heart rate remains low (<55 to 60 bpm), medication may need to be discontinued and an alternative considered, probably consider Amlodipine 5 mg or Losartan 50 mg with monitoring of renal function Presently BP is under excellent control. - Reduce Zykadia (bisoprolol ) dose to 2.5 mg - Instruct patient to cut the medication in half - Send a note to the family doctor to reassess the medication post-surgery - Advise follow-up with family doctor to recheck heart rate post-surgery   Lumbosacral Pain Chronic lumbosacral pain since Dec  17, exacerbated by bending. Scheduled for thoracolumbar kyphoplasty.  -Cleared for surgery. Discussed procedure, including cement stabilization and anticipated pain relief.  No additional tests required pre-surgery. - Clear patient for thoracolumbar kyphoplasty. Letter sent. - Ensure patient has stopped aspirin a week before surgery  Asthma Asthma is well-controlled with no recent exacerbations or symptoms. - Continue current asthma management  General Health Maintenance Routine health maintenance discussed, including medication management and follow-up care. - Review and adjust medications as necessary - Follow up with family doctor post-surgery for medication reassessment  Follow-up - Follow up with family doctor on May 20, 2023 - Reassess heart rate and medication with family doctor post-surgery - PRN follow-up with cardiologist if needed.       Signed,  Gordy Bergamo, MD, [facility] 05/14/2023, 8:44 AM [facility] [address] [place], [place] [address] Phone: [phone]. Fax:  [phone]

## 2023-05-13 NOTE — Patient Instructions (Signed)
 Medication Instructions:  Decrease Bisoprolol  to 2.5 mg daily.  If heart rate doesn't improve, you may discontinue it. Continue all other medications as listed.  *If you need a refill on your cardiac medications before your next appointment, please call your pharmacy*  Follow-Up: At [facility], you and your health needs are our priority.  As part of our continuing mission to provide you with exceptional heart care, we have created designated Provider Care Teams.  These Care Teams include your primary Cardiologist (physician) and Advanced Practice Providers (APPs -  Physician Assistants and Nurse Practitioners) who all work together to provide you with the care you need, when you need it.  We recommend signing up for the patient portal called MyChart.  Sign up information is provided on this After Visit Summary.  MyChart is used to connect with patients for Virtual Visits (Telemedicine).  Patients are able to view lab/test results, encounter notes, upcoming appointments, etc.  Non-urgent messages can be sent to your provider as well.   To learn more about what you can do with MyChart, go to [email or web address].    Your next appointment:   Follow up as needed.  Ok for surgery!!  Metta Sours

## 2023-05-13 NOTE — Progress Notes (Signed)
 PCP - Laveda Debby COME Cardiologist - Gordy Ladona COME. Will see Reche Vannie PIETY at [facility] on 2/11 for cardiac clearance.  PPM/ICD - denies Device Orders -  Rep Notified -   Chest x-ray -  EKG - 05/13/23 Stress Test - denies ECHO - 09/22/20 Cardiac Cath - 12/28/06  Sleep Study - denies CPAP -   Fasting Blood Sugar - na Checks Blood Sugar _____ times a day  Last dose of GLP1 agonist-  na GLP1 instructions:   Blood Thinner Instructions:na Aspirin Instructions:LD 2/4  ERAS Protcol - clears until 0910 PRE-SURGERY Ensure or G2-Ensure   COVID TEST- na   Anesthesia review: yes-marked bradycardia.Pt asymptomatic. She denied any dizziness, fatigue, syncope in the past. She had not taken her Bisoprolol  this morning. [employer] sent to Reche Vannie PIETY to notify her of pt's bradycardia.   Patient denies shortness of breath, fever, cough and chest pain at PAT appointment   All instructions explained to the patient, with a verbal understanding of the material. Patient agrees to go over the instructions while at home for a better understanding. Patient also instructed to wear a mask when out in public prior to surgery. The opportunity to ask questions was provided.

## 2023-05-14 ENCOUNTER — Encounter: Payer: Self-pay | Admitting: Cardiology

## 2023-05-16 ENCOUNTER — Encounter (HOSPITAL_COMMUNITY): Payer: Self-pay

## 2023-05-16 NOTE — Anesthesia Preprocedure Evaluation (Addendum)
Anesthesia Evaluation  Patient identified by MRN, date of birth, ID band Patient awake    Reviewed: Allergy & Precautions, H&P , NPO status , Patient's Chart, lab work & pertinent test results  History of Anesthesia Complications (+) PONV and history of anesthetic complications  Airway Mallampati: II  TM Distance: >3 FB Neck ROM: Full    Dental no notable dental hx.    Pulmonary asthma    Pulmonary exam normal breath sounds clear to auscultation       Cardiovascular hypertension, Pt. on medications negative cardio ROS Normal cardiovascular exam Rhythm:Regular Rate:Normal     Neuro/Psych negative neurological ROS  negative psych ROS   GI/Hepatic Neg liver ROS,GERD  ,,  Endo/Other  negative endocrine ROS    Renal/GU negative Renal ROS  negative genitourinary   Musculoskeletal negative musculoskeletal ROS (+)    Abdominal   Peds negative pediatric ROS (+)  Hematology negative hematology ROS (+)   Anesthesia Other Findings   Reproductive/Obstetrics negative OB ROS                             Anesthesia Physical Anesthesia Plan  ASA: 2  Anesthesia Plan: General   Post-op Pain Management:    Induction: Intravenous  PONV Risk Score and Plan: 4 or greater and Ondansetron, Dexamethasone, Midazolam, Droperidol and Treatment may vary due to age or medical condition  Airway Management Planned: Oral ETT  Additional Equipment:   Intra-op Plan:   Post-operative Plan: Extubation in OR  Informed Consent: I have reviewed the patients History and Physical, chart, labs and discussed the procedure including the risks, benefits and alternatives for the proposed anesthesia with the patient or authorized representative who has indicated his/her understanding and acceptance.     Dental advisory given  Plan Discussed with: CRNA  Anesthesia Plan Comments: (See PAT note from 2/7 by Sherlie Ban  PA-C )        Anesthesia Quick Evaluation

## 2023-05-16 NOTE — Progress Notes (Signed)
 Case: [id number] Date/Time: 05/18/23 1153   Procedure: THORACIC 11, 12 - LUMBAR 1 KYPHOPLASTY   Anesthesia type: General   Pre-op diagnosis: CONPRESSION FRACTURE   Location: [facility] ROOM 05 / [facility]   Surgeons: Beuford Anes, MD       DISCUSSION: Nicole Price is a 73 yo female who presents to PAT prior to surgery above. PMH of HTN, bradycardia, asthma, GERD.  Prior anesthesia complications include PONV  Patient seen by Cardiology on 05/13/23 for pre op clearance due to bradycardia on EKG at PAT visit. She has been taking Bisoprolol  for HTN and the dose was recommended to be cut in half. HR was in the 50s at office visit. Cleared for surgery by Dr. Ladona and advised f/u prn:  Cleared for surgery. Discussed procedure, including cement stabilization and anticipated pain relief. No additional tests required pre-surgery.   VS: BP (!) 113/59   Pulse (!) 45   Temp 36.6 C (Oral)   Resp 18   Ht 5' 3 (1.6 m)   Wt 73.6 kg   SpO2 97%   BMI 28.73 kg/m   PROVIDERS: Debby Laveda NOVAK, MD   LABS: Labs reviewed: Acceptable for surgery. (all labs ordered are listed, but only abnormal results are displayed)  Labs Reviewed  SURGICAL PCR SCREEN - Abnormal; Notable for the following components:      Result Value   Staphylococcus aureus POSITIVE (*)    All other components within normal limits  CBC - Abnormal; Notable for the following components:   Hemoglobin 15.1 (*)    HCT 47.2 (*)    All other components within normal limits  BASIC METABOLIC PANEL     IMAGES:   EKG: Marked sinus bradycardia, rate 43 Left axis deviation Minimal voltage criteria for LVH, may be normal variant ( R in aVL )  CV:  Echo 01/19/2021 (scanned in epic):  Conclusions:  Overall LV systolic function is normal with an EF between 65-70% The RV is normal in size and function The left atrium is normal in size The right atrium is normal in size and function There is mild aortic valve sclerosis There is  trace mitral regurgitation Trace tricuspid regurgitation present The pulmonic valve is normal The aortic root, ascending aorta, and aortic arch appear normal There is no pericardial effusion.   Past Medical History:  Diagnosis Date   Acute pancreatitis    Aortic aneurysm (HCC)    Asthma    Benign essential hypertension    Colon polyps    GERD (gastroesophageal reflux disease)    Hypercholesteremia    Osteoporosis    Pancreatitis    PONV (postoperative nausea and vomiting)     Past Surgical History:  Procedure Laterality Date   BACK SURGERY  2004   x2   CATARACT EXTRACTION, BILATERAL  2019   CHOLECYSTECTOMY  2019   COLONOSCOPY  06/06/2015   Mild sigmoid diverticulosis. Small internal and external hemorrhoids.   EYE SURGERY Right 2023   vitrectomy   JOINT REPLACEMENT Right 2021   Total hip replacement   PARTIAL HYSTERECTOMY  2016   TUBAL LIGATION  1977    MEDICATIONS:  albuterol  (PROVENTIL ) (2.5 MG/3ML) 0.083% nebulizer solution   aspirin 81 MG EC tablet   bisoprolol  (ZEBETA ) 5 MG tablet   Cholecalciferol (VITAMIN D) 50 MCG (2000 UT) CAPS   Flaxseed Oil (LINSEED OIL) OIL   montelukast (SINGULAIR) 10 MG tablet   pantoprazole (PROTONIX) 40 MG tablet   simvastatin (ZOCOR) 20 MG tablet  VENTOLIN  HFA 108 (90 Base) MCG/ACT inhaler   No current facility-administered medications for this encounter.   Burnard CHRISTELLA Odis DEVONNA [facility] Short Stay/Anesthesiology [facility] Phone [phone] 05/16/2023 12:01 PM

## 2023-05-17 ENCOUNTER — Ambulatory Visit (HOSPITAL_BASED_OUTPATIENT_CLINIC_OR_DEPARTMENT_OTHER): Payer: Medicare Other | Admitting: Family

## 2023-05-18 ENCOUNTER — Ambulatory Visit (HOSPITAL_COMMUNITY)
Admission: RE | Admit: 2023-05-18 | Discharge: 2023-05-18 | Disposition: A | Discharge status: Home / Self Care | Payer: Medicare Other | Attending: Orthopedic Surgery | Admitting: Orthopedic Surgery

## 2023-05-18 ENCOUNTER — Other Ambulatory Visit: Payer: Self-pay

## 2023-05-18 ENCOUNTER — Encounter (HOSPITAL_COMMUNITY): Payer: Self-pay | Admitting: Orthopedic Surgery

## 2023-05-18 ENCOUNTER — Ambulatory Visit (HOSPITAL_BASED_OUTPATIENT_CLINIC_OR_DEPARTMENT_OTHER): Payer: Medicare Other | Admitting: Certified Registered Nurse Anesthetist

## 2023-05-18 ENCOUNTER — Ambulatory Visit (HOSPITAL_COMMUNITY): Payer: Medicare Other

## 2023-05-18 ENCOUNTER — Other Ambulatory Visit (HOSPITAL_COMMUNITY): Payer: Self-pay

## 2023-05-18 ENCOUNTER — Ambulatory Visit (HOSPITAL_COMMUNITY): Payer: Medicare Other | Admitting: Medical

## 2023-05-18 ENCOUNTER — Encounter (HOSPITAL_COMMUNITY)
Admission: RE | Disposition: A | Discharge status: Home / Self Care | Payer: Self-pay | Source: Home / Self Care | Attending: Orthopedic Surgery

## 2023-05-18 DIAGNOSIS — J45909 Unspecified asthma, uncomplicated: Secondary | ICD-10-CM | POA: Diagnosis not present

## 2023-05-18 DIAGNOSIS — K219 Gastro-esophageal reflux disease without esophagitis: Secondary | ICD-10-CM | POA: Diagnosis not present

## 2023-05-18 DIAGNOSIS — M4854XS Collapsed vertebra, not elsewhere classified, thoracic region, sequela of fracture: Secondary | ICD-10-CM

## 2023-05-18 DIAGNOSIS — I10 Essential (primary) hypertension: Secondary | ICD-10-CM | POA: Diagnosis not present

## 2023-05-18 DIAGNOSIS — Z79899 Other long term (current) drug therapy: Secondary | ICD-10-CM | POA: Diagnosis not present

## 2023-05-18 DIAGNOSIS — M8008XA Age-related osteoporosis with current pathological fracture, vertebra(e), initial encounter for fracture: Secondary | ICD-10-CM | POA: Insufficient documentation

## 2023-05-18 DIAGNOSIS — M4855XA Collapsed vertebra, not elsewhere classified, thoracolumbar region, initial encounter for fracture: Secondary | ICD-10-CM

## 2023-05-18 DIAGNOSIS — Z8249 Family history of ischemic heart disease and other diseases of the circulatory system: Secondary | ICD-10-CM | POA: Diagnosis not present

## 2023-05-18 HISTORY — PX: KYPHOPLASTY: SHX5884

## 2023-05-18 SURGERY — KYPHOPLASTY
Anesthesia: General | Site: Spine Lumbar

## 2023-05-18 MED ORDER — EPHEDRINE 5 MG/ML INJ
INTRAVENOUS | Status: AC
  Filled 2023-05-18: qty 5

## 2023-05-18 MED ORDER — DEXAMETHASONE SODIUM PHOSPHATE 10 MG/ML IJ SOLN
INTRAMUSCULAR | Status: DC | PRN
  Administered 2023-05-18: 10 mg via INTRAVENOUS

## 2023-05-18 MED ORDER — DEXMEDETOMIDINE HCL IN NACL 80 MCG/20ML IV SOLN
INTRAVENOUS | Status: DC | PRN
Start: 2023-05-18 — End: 2023-05-18
  Administered 2023-05-18: 12 ug via INTRAVENOUS
  Administered 2023-05-18: 8 ug via INTRAVENOUS

## 2023-05-18 MED ORDER — BUPIVACAINE-EPINEPHRINE (PF) 0.25% -1:200000 IJ SOLN
INTRAMUSCULAR | Status: DC | PRN
  Administered 2023-05-18: 30 mL

## 2023-05-18 MED ORDER — PROPOFOL 10 MG/ML IV BOLUS
INTRAVENOUS | Status: AC
  Filled 2023-05-18: qty 20

## 2023-05-18 MED ORDER — HYDROMORPHONE HCL 1 MG/ML IJ SOLN
0.2500 mg | INTRAMUSCULAR | Status: DC | PRN
  Administered 2023-05-18: 0.5 mg via INTRAVENOUS

## 2023-05-18 MED ORDER — BACITRACIN ZINC 500 UNIT/GM EX OINT
TOPICAL_OINTMENT | CUTANEOUS | Status: AC
  Filled 2023-05-18: qty 28.35

## 2023-05-18 MED ORDER — DEXAMETHASONE SODIUM PHOSPHATE 10 MG/ML IJ SOLN
INTRAMUSCULAR | Status: AC
  Filled 2023-05-18: qty 1

## 2023-05-18 MED ORDER — BUPIVACAINE-EPINEPHRINE (PF) 0.25% -1:200000 IJ SOLN
INTRAMUSCULAR | Status: AC
  Filled 2023-05-18: qty 30

## 2023-05-18 MED ORDER — 0.9 % SODIUM CHLORIDE (POUR BTL) OPTIME
TOPICAL | Status: DC | PRN
  Administered 2023-05-18 (×2): 1000 mL

## 2023-05-18 MED ORDER — CHLORHEXIDINE GLUCONATE 0.12 % MT SOLN: 15.0000 mL | Freq: Once | OROMUCOSAL | Status: AC

## 2023-05-18 MED ORDER — PHENYLEPHRINE HCL-NACL 20-0.9 MG/250ML-% IV SOLN
INTRAVENOUS | Status: DC | PRN
  Administered 2023-05-18: 15 ug/min via INTRAVENOUS

## 2023-05-18 MED ORDER — ROCURONIUM BROMIDE 10 MG/ML (PF) SYRINGE
PREFILLED_SYRINGE | INTRAVENOUS | Status: AC
  Filled 2023-05-18: qty 10

## 2023-05-18 MED ORDER — HYDROCODONE-ACETAMINOPHEN 5-325 MG PO TABS
1.0000 | ORAL_TABLET | Freq: Four times a day (QID) | ORAL | 0 refills | Status: DC | PRN
Start: 2023-05-18 — End: 2023-08-17
  Filled 2023-05-18: qty 20, 5d supply, fill #0

## 2023-05-18 MED ORDER — SODIUM CHLORIDE 0.9 % IV SOLN: 12.5000 mg | INTRAVENOUS | Status: DC | PRN

## 2023-05-18 MED ORDER — LIDOCAINE 2% (20 MG/ML) 5 ML SYRINGE
INTRAMUSCULAR | Status: DC | PRN
  Administered 2023-05-18: 60 mg via INTRAVENOUS

## 2023-05-18 MED ORDER — BACITRACIN 500 UNIT/GM EX OINT
TOPICAL_OINTMENT | CUTANEOUS | Status: DC | PRN
  Administered 2023-05-18: 1

## 2023-05-18 MED ORDER — LACTATED RINGERS IV SOLN: INTRAVENOUS | Status: DC

## 2023-05-18 MED ORDER — ROCURONIUM BROMIDE 10 MG/ML (PF) SYRINGE
PREFILLED_SYRINGE | INTRAVENOUS | Status: DC | PRN
Start: 2023-05-18 — End: 2023-05-18
  Administered 2023-05-18: 60 mg via INTRAVENOUS

## 2023-05-18 MED ORDER — DEXMEDETOMIDINE HCL IN NACL 80 MCG/20ML IV SOLN
INTRAVENOUS | Status: AC
  Filled 2023-05-18: qty 20

## 2023-05-18 MED ORDER — OXYCODONE HCL 5 MG/5ML PO SOLN: 5.0000 mg | Freq: Once | ORAL | Status: DC | PRN

## 2023-05-18 MED ORDER — CEFAZOLIN SODIUM-DEXTROSE 2-4 GM/100ML-% IV SOLN
2.0000 g | INTRAVENOUS | Status: AC
Start: 2023-05-18 — End: 2023-05-18
  Administered 2023-05-18: 2 g via INTRAVENOUS

## 2023-05-18 MED ORDER — ONDANSETRON HCL 4 MG/2ML IJ SOLN
INTRAMUSCULAR | Status: DC | PRN
  Administered 2023-05-18: 4 mg via INTRAVENOUS

## 2023-05-18 MED ORDER — CEFAZOLIN SODIUM-DEXTROSE 2-4 GM/100ML-% IV SOLN
INTRAVENOUS | Status: AC
  Filled 2023-05-18: qty 100

## 2023-05-18 MED ORDER — FENTANYL CITRATE (PF) 250 MCG/5ML IJ SOLN
INTRAMUSCULAR | Status: DC | PRN
  Administered 2023-05-18 (×2): 50 ug via INTRAVENOUS

## 2023-05-18 MED ORDER — EPHEDRINE SULFATE-NACL 50-0.9 MG/10ML-% IV SOSY
PREFILLED_SYRINGE | INTRAVENOUS | Status: DC | PRN
  Administered 2023-05-18 (×4): 5 mg via INTRAVENOUS

## 2023-05-18 MED ORDER — IOPAMIDOL (ISOVUE-300) INJECTION 61%
INTRAVENOUS | Status: DC | PRN
  Administered 2023-05-18: 100 mL

## 2023-05-18 MED ORDER — OXYCODONE HCL 5 MG PO TABS: 5.0000 mg | ORAL_TABLET | Freq: Once | ORAL | Status: DC | PRN

## 2023-05-18 MED ORDER — LIDOCAINE 2% (20 MG/ML) 5 ML SYRINGE
INTRAMUSCULAR | Status: AC
  Filled 2023-05-18: qty 5

## 2023-05-18 MED ORDER — METHOCARBAMOL 750 MG PO TABS
750.0000 mg | ORAL_TABLET | Freq: Four times a day (QID) | ORAL | 0 refills | Status: DC | PRN
  Filled 2023-05-18: qty 60, 15d supply, fill #0

## 2023-05-18 MED ORDER — SUGAMMADEX SODIUM 200 MG/2ML IV SOLN
INTRAVENOUS | Status: DC | PRN
  Administered 2023-05-18: 100 mg via INTRAVENOUS

## 2023-05-18 MED ORDER — HYDROMORPHONE HCL 1 MG/ML IJ SOLN
INTRAMUSCULAR | Status: AC
  Filled 2023-05-18: qty 1

## 2023-05-18 MED ORDER — POVIDONE-IODINE 7.5 % EX SOLN
Freq: Once | CUTANEOUS | Status: DC
Start: 2023-05-18 — End: 2023-05-18
  Filled 2023-05-18: qty 118

## 2023-05-18 MED ORDER — ORAL CARE MOUTH RINSE: 15.0000 mL | Freq: Once | OROMUCOSAL | Status: AC

## 2023-05-18 MED ORDER — PROPOFOL 10 MG/ML IV BOLUS
INTRAVENOUS | Status: DC | PRN
  Administered 2023-05-18: 130 mg via INTRAVENOUS

## 2023-05-18 MED ORDER — CHLORHEXIDINE GLUCONATE 0.12 % MT SOLN
OROMUCOSAL | Status: AC
  Administered 2023-05-18: 15 mL via OROMUCOSAL
  Filled 2023-05-18: qty 15

## 2023-05-18 MED ORDER — FENTANYL CITRATE (PF) 250 MCG/5ML IJ SOLN
INTRAMUSCULAR | Status: AC
  Filled 2023-05-18: qty 5

## 2023-05-18 MED ORDER — ONDANSETRON HCL 4 MG/2ML IJ SOLN
INTRAMUSCULAR | Status: AC
  Filled 2023-05-18: qty 2

## 2023-05-18 SURGICAL SUPPLY — 48 items
BAG COUNTER SPONGE SURGICOUNT (BAG) ×1 IMPLANT
BENZOIN TINCTURE PRP APPL 2/3 (GAUZE/BANDAGES/DRESSINGS) IMPLANT
BLADE SURG 15 STRL LF DISP TIS (BLADE) ×1 IMPLANT
CEMENT KYPHON C01A KIT/MIXER (Cement) IMPLANT
COVER MAYO STAND STRL (DRAPES) ×1 IMPLANT
COVER SURGICAL LIGHT HANDLE (MISCELLANEOUS) ×1 IMPLANT
CURETTE EXPRESS SZ2 7MM (INSTRUMENTS) IMPLANT
CURRETTE EXPRESS SZ2 7MM (INSTRUMENTS)
DRAPE C-ARM 42X72 X-RAY (DRAPES) ×1 IMPLANT
DRAPE HALF SHEET 40X57 (DRAPES) IMPLANT
DRAPE INCISE IOBAN 66X45 STRL (DRAPES) ×1 IMPLANT
DRAPE LAPAROTOMY T 102X78X121 (DRAPES) ×1 IMPLANT
DRAPE SURG 17X23 STRL (DRAPES) ×4 IMPLANT
DRAPE WARM FLUID 44X44 (DRAPES) ×1 IMPLANT
DURAPREP 26ML APPLICATOR (WOUND CARE) ×1 IMPLANT
GAUZE 4X4 16PLY ~~LOC~~+RFID DBL (SPONGE) ×1 IMPLANT
GAUZE SPONGE 2X2 8PLY STRL LF (GAUZE/BANDAGES/DRESSINGS) ×1 IMPLANT
GAUZE SPONGE 2X2 STRL 8-PLY (GAUZE/BANDAGES/DRESSINGS) IMPLANT
GAUZE SPONGE 4X4 12PLY STRL (GAUZE/BANDAGES/DRESSINGS) IMPLANT
GLOVE BIO SURGEON STRL SZ 6.5 (GLOVE) ×1 IMPLANT
GLOVE BIO SURGEON STRL SZ8 (GLOVE) ×1 IMPLANT
GLOVE BIOGEL PI IND STRL 7.0 (GLOVE) ×1 IMPLANT
GLOVE BIOGEL PI IND STRL 8 (GLOVE) ×1 IMPLANT
GLOVE SURG ENC MOIS LTX SZ6.5 (GLOVE) ×1 IMPLANT
GOWN STRL REUS W/ TWL LRG LVL3 (GOWN DISPOSABLE) ×2 IMPLANT
GOWN STRL REUS W/ TWL XL LVL3 (GOWN DISPOSABLE) ×1 IMPLANT
INTRODUCER DEVICE OSTEO LEVEL (INTRODUCER) IMPLANT
KIT BASIN OR (CUSTOM PROCEDURE TRAY) ×1 IMPLANT
KIT TURNOVER KIT B (KITS) ×1 IMPLANT
NDL 22X1.5 STRL (OR ONLY) (MISCELLANEOUS) IMPLANT
NDL HYPO 25X1 1.5 SAFETY (NEEDLE) ×1 IMPLANT
NDL SPNL 18GX3.5 QUINCKE PK (NEEDLE) ×2 IMPLANT
NEEDLE 22X1.5 STRL (OR ONLY) (MISCELLANEOUS)
NEEDLE HYPO 25X1 1.5 SAFETY (NEEDLE) ×1
NEEDLE SPNL 18GX3.5 QUINCKE PK (NEEDLE) ×2
NS IRRIG 1000ML POUR BTL (IV SOLUTION) ×1 IMPLANT
PACK UNIVERSAL I (CUSTOM PROCEDURE TRAY) ×1 IMPLANT
PAD ARMBOARD 7.5X6 YLW CONV (MISCELLANEOUS) ×2 IMPLANT
POSITIONER HEAD PRONE TRACH (MISCELLANEOUS) ×1 IMPLANT
STRIP CLOSURE SKIN 1/4X4 (GAUZE/BANDAGES/DRESSINGS) IMPLANT
SUT MNCRL AB 4-0 PS2 18 (SUTURE) ×1 IMPLANT
SYR BULB IRRIG 60ML STRL (SYRINGE) ×1 IMPLANT
SYR CONTROL 10ML LL (SYRINGE) ×1 IMPLANT
TAPE CLOTH 4X10 WHT NS (GAUZE/BANDAGES/DRESSINGS) IMPLANT
TOWEL GREEN STERILE (TOWEL DISPOSABLE) ×1 IMPLANT
TOWEL GREEN STERILE FF (TOWEL DISPOSABLE) ×1 IMPLANT
TRAY KYPHOPAK 15/3 ONESTEP 1ST (MISCELLANEOUS) IMPLANT
TRAY KYPHOPAK 20/3 ONESTEP 1ST (MISCELLANEOUS) IMPLANT

## 2023-05-18 NOTE — Discharge Instructions (Signed)
Keep dressing dry. No brace needed. May shower in 5 days.

## 2023-05-18 NOTE — Transfer of Care (Signed)
Immediate Anesthesia Transfer of Care Note  Patient: Nicole Price  Procedure(s) Performed: THORACIC ELEVEN, THORACIC TWELVE, LUMBAR ONE KYPHOPLASTY (Spine Lumbar)  Patient Location: PACU  Anesthesia Type:General  Level of Consciousness: awake, alert , oriented, patient cooperative, and responds to stimulation  Airway & Oxygen Therapy: Patient Spontanous Breathing and Patient connected to nasal cannula oxygen  Post-op Assessment: Report given to RN, Post -op Vital signs reviewed and stable, and Patient moving all extremities X 4  Post vital signs: Reviewed and stable  Last Vitals:  Vitals Value Taken Time  BP 113/70 05/18/23 1426  Temp    Pulse 79 05/18/23 1428  Resp 21 05/18/23 1428  SpO2 98 % 05/18/23 1428  Vitals shown include unfiled device data.  Last Pain:  Vitals:   05/18/23 0949  PainSc: 0-No pain         Complications: No notable events documented.

## 2023-05-18 NOTE — Anesthesia Procedure Notes (Signed)
Procedure Name: Intubation Date/Time: 05/18/2023 1:01 PM  Performed by: Shary Decamp, CRNAPre-anesthesia Checklist: Patient identified, Patient being monitored, Timeout performed, Emergency Drugs available and Suction available Patient Re-evaluated:Patient Re-evaluated prior to induction Oxygen Delivery Method: Circle System Utilized Preoxygenation: Pre-oxygenation with 100% oxygen Induction Type: IV induction Ventilation: Mask ventilation without difficulty Laryngoscope Size: Miller and 2 Grade View: Grade I Tube type: Oral Tube size: 7.0 mm Number of attempts: 1 Airway Equipment and Method: Stylet Placement Confirmation: ETT inserted through vocal cords under direct vision, positive ETCO2 and breath sounds checked- equal and bilateral Secured at: 21 cm Tube secured with: Tape Dental Injury: Teeth and Oropharynx as per pre-operative assessment

## 2023-05-18 NOTE — H&P (Signed)
PREOPERATIVE H&P  Chief Complaint: Mid to low back pain  HPI: Nicole Price is a 73 y.o. female who presents with ongoing pain in the lower back  MRI reveals compression fractures at T11, T12, and L1  Patient has failed multiple forms of conservative care and continues to have pain (see office notes for additional details regarding the patient's full course of treatment)  Past Medical History:  Diagnosis Date   Acute pancreatitis    Aortic aneurysm (HCC)    family hx   Asthma    Benign essential hypertension    Colon polyps    GERD (gastroesophageal reflux disease)    Hypercholesteremia    Osteoporosis    PONV (postoperative nausea and vomiting)    Past Surgical History:  Procedure Laterality Date   BACK SURGERY  2004   x2   CATARACT EXTRACTION, BILATERAL  2019   CHOLECYSTECTOMY  2019   COLONOSCOPY  06/06/2015   Mild sigmoid diverticulosis. Small internal and external hemorrhoids.   EYE SURGERY Right 2023   vitrectomy   JOINT REPLACEMENT Right 2021   Total hip replacement   PARTIAL HYSTERECTOMY  2016   TUBAL LIGATION  1977   Social History   Socioeconomic History   Marital status: Married    Spouse name: Not on file   Number of children: 2   Years of education: Not on file   Highest education level: Not on file  Occupational History   Not on file  Tobacco Use   Smoking status: Never   Smokeless tobacco: Never  Vaping Use   Vaping status: Never Used  Substance and Sexual Activity   Alcohol use: Never   Drug use: Never   Sexual activity: Not on file  Other Topics Concern   Not on file  Social History Narrative   Not on file   Social Drivers of Health   Financial Resource Strain: Not on file  Food Insecurity: Not on file  Transportation Needs: Not on file  Physical Activity: Not on file  Stress: Not on file  Social Connections: Not on file   Family History  Problem Relation Age of Onset   Heart attack Father    Arthritis Sister     Asthma Brother    Allergies  Allergen Reactions   Nickel Itching and Swelling    Itchy and makes lips swell   Prior to Admission medications   Medication Sig Start Date End Date Taking? Authorizing Provider  aspirin 81 MG EC tablet Take 81 mg by mouth daily.   Yes [provider]  bisoprolol (ZEBETA) 5 MG tablet Take 0.5 tablets (2.5 mg total) by mouth daily. 05/13/23  Yes Yates Decamp, MD  Cholecalciferol (VITAMIN D) 50 MCG (2000 UT) CAPS Take 2,000 Units by mouth daily.   Yes [provider]  Flaxseed Oil (LINSEED OIL) OIL Take 1,000 mg by mouth daily.   Yes [provider]  montelukast (SINGULAIR) 10 MG tablet Take 10 mg by mouth daily. 11/01/19  Yes [provider]  pantoprazole (PROTONIX) 40 MG tablet Take 40 mg by mouth daily as needed (acid reflux).   Yes [provider]  simvastatin (ZOCOR) 20 MG tablet Take 20 mg by mouth daily. 10/10/19  Yes [provider]  VENTOLIN HFA 108 (90 Base) MCG/ACT inhaler Inhale 1-2 puffs into the lungs every 6 (six) hours as needed for wheezing or shortness of breath. 09/06/19  Yes [provider]  albuterol (PROVENTIL) (2.5 MG/3ML) 0.083%  nebulizer solution Take 2.5 mg by nebulization every 6 (six) hours as needed for wheezing or shortness of breath.    [provider]     All other systems have been reviewed and were otherwise negative with the exception of those mentioned in the HPI and as above.  Physical Exam: Vitals:   05/18/23 0903  BP: 129/64  Pulse: 68  Resp: 18  Temp: 97.9 F (36.6 C)  SpO2: 95%    Body mass index is 28.7 kg/m.  General: Alert, no acute distress Cardiovascular: No pedal edema Respiratory: No cyanosis, no use of accessory musculature Skin: No lesions in the area of chief complaint Neurologic: Sensation intact distally Psychiatric: Patient is competent for consent with normal mood and affect Lymphatic: No axillary or cervical  lymphadenopathy  MUSCULOSKELETAL:   Assessment/Plan: CONPRESSION FRACTURES AT T11, T12, AND L1 Plan for Procedure(s): THORACIC 11 AND 12, LUMBAR 1 KYPHOPLASTY   Jackelyn Hoehn, MD 05/18/2023 11:40 AM

## 2023-05-18 NOTE — Op Note (Signed)
PATIENT NAME: Nicole Price   MEDICAL RECORD NO.:   [id number]    DATE OF BIRTH: 1950/04/16   DATE OF PROCEDURE: 05/18/2023                              OPERATIVE REPORT   PREOPERATIVE DIAGNOSIS:  Osteoporotic T11, T12, and L1 compression fractures  POSTOPERATIVE DIAGNOSIS:  Osteoporotic T11, T12, and L1 compression fractures  PROCEDURE:  T11, T12 and L1 kyphoplasty procedures  SURGEON:  Estill Bamberg, MD.  ASSISTANTJason Coop, PA-C  ANESTHESIA:  General endotracheal anesthesia.  COMPLICATIONS:  None.  DISPOSITION:  Stable.  ESTIMATED BLOOD LOSS:  Minimal.  INDICATIONS FOR SURGERY:  Briefly, [name] is a very pleasant 73 y.o.- year-old female, who did present to me with severe pain in her mid to lower back. Her pain was rather severe.  The patient's imaging studies did clearly reveal compression fractures at the levels noted above. We did attempt a trial of nonoperative treatment, but the patient continued to feel rather debilitated as a result of her ongoing pain.  Given her ongoing pain and dysfunction, we did discuss proceeding with the procedure noted above.  I did fully discuss the procedure with the patient, and she did wish to proceed.  OPERATIVE DETAILS:  On 05/18/2023, the patient was brought to surgery and general endotracheal anesthesia was administered.  The patient was placed prone on a well-padded flat Jackson bed with gel rolls placed under the patient's chest and hips.  Antibiotics were given.  AP and lateral fluoroscopy was brought into the field.  The T11, T12, and L1 pedicles were marked out in the usual fashion.  After a time-out procedure was performed, I did advance Jamshidis across the T11, T12 and L1 pedicles on the right and left sides.  I then drilled through the Jamshidis.  I then inserted kyphoplasty balloons and I was able to inflate the balloons with approximately 4 cc of contrast at each level. The balloons were deflated then  removed.  At this point, after the cement was mixed, a total of approximately 3-4 cc of cement was injected through the right and left cannulas at each level, half on the right, and half on the left.  Excellent interdigitation of cement was identified.  No abnormal extravasation was noted. The cement was then allowed to harden, after which point the Jamshidis were removed.  The wound was then irrigated and closed using 4-0 Monocryl.  Bacitracin and a sterile dressing were applied.  The patient was then awoken from general endotracheal anesthesia and transferred to recovery in stable condition.  Estill Bamberg, MD

## 2023-05-19 ENCOUNTER — Encounter: Payer: Self-pay | Admitting: Gastroenterology

## 2023-05-19 ENCOUNTER — Ambulatory Visit (INDEPENDENT_AMBULATORY_CARE_PROVIDER_SITE_OTHER): Payer: Medicare Other | Admitting: Gastroenterology

## 2023-05-19 ENCOUNTER — Other Ambulatory Visit (INDEPENDENT_AMBULATORY_CARE_PROVIDER_SITE_OTHER): Payer: Medicare Other

## 2023-05-19 VITALS — BP 110/68 | HR 61 | Ht 63.0 in | Wt 162.5 lb

## 2023-05-19 DIAGNOSIS — Z9049 Acquired absence of other specified parts of digestive tract: Secondary | ICD-10-CM | POA: Diagnosis not present

## 2023-05-19 DIAGNOSIS — K219 Gastro-esophageal reflux disease without esophagitis: Secondary | ICD-10-CM

## 2023-05-19 DIAGNOSIS — K859 Acute pancreatitis without necrosis or infection, unspecified: Secondary | ICD-10-CM | POA: Diagnosis not present

## 2023-05-19 LAB — CBC WITH DIFFERENTIAL/PLATELET
Basophils Absolute: 0 10*3/uL (ref 0.0–0.1)
Basophils Relative: 0.3 % (ref 0.0–3.0)
Eosinophils Absolute: 0 10*3/uL (ref 0.0–0.7)
Eosinophils Relative: 0.1 % (ref 0.0–5.0)
HCT: 44.6 % (ref 36.0–46.0)
Hemoglobin: 14.7 g/dL (ref 12.0–15.0)
Lymphocytes Relative: 12.8 % (ref 12.0–46.0)
Lymphs Abs: 1.9 10*3/uL (ref 0.7–4.0)
MCHC: 33 g/dL (ref 30.0–36.0)
MCV: 91.1 fL (ref 78.0–100.0)
Monocytes Absolute: 1.3 10*3/uL — ABNORMAL HIGH (ref 0.1–1.0)
Monocytes Relative: 8.4 % (ref 3.0–12.0)
Neutro Abs: 11.8 10*3/uL — ABNORMAL HIGH (ref 1.4–7.7)
Neutrophils Relative %: 78.4 % — ABNORMAL HIGH (ref 43.0–77.0)
Platelets: 214 10*3/uL (ref 150.0–400.0)
RBC: 4.89 Mil/uL (ref 3.87–5.11)
RDW: 14.1 % (ref 11.5–15.5)
WBC: 15 10*3/uL — ABNORMAL HIGH (ref 4.0–10.5)

## 2023-05-19 LAB — COMPREHENSIVE METABOLIC PANEL
ALT: 17 U/L (ref 0–35)
AST: 19 U/L (ref 0–37)
Albumin: 4.5 g/dL (ref 3.5–5.2)
Alkaline Phosphatase: 94 U/L (ref 39–117)
BUN: 17 mg/dL (ref 6–23)
CO2: 24 meq/L (ref 19–32)
Calcium: 10.3 mg/dL (ref 8.4–10.5)
Chloride: 106 meq/L (ref 96–112)
Creatinine, Ser: 0.94 mg/dL (ref 0.40–1.20)
GFR: 59.96 mL/min — ABNORMAL LOW (ref >60.00)
Glucose, Bld: 107 mg/dL — ABNORMAL HIGH (ref 70–99)
Potassium: 4.2 meq/L (ref 3.5–5.1)
Sodium: 140 meq/L (ref 135–145)
Total Bilirubin: 1 mg/dL (ref 0.2–1.2)
Total Protein: 7.3 g/dL (ref 6.0–8.3)

## 2023-05-19 LAB — LIPASE: Lipase: 17 U/L (ref 11.0–59.0)

## 2023-05-19 NOTE — Patient Instructions (Addendum)
_______________________________________________________  If your blood pressure at your visit was 140/90 or greater, please contact your primary care physician to follow up on this.  _______________________________________________________  If you are age 74 or older, your body mass index should be between 23-30. Your Body mass index is 28.79 kg/m. If this is out of the aforementioned range listed, please consider follow up with your Primary Care Provider.  If you are age 53 or younger, your body mass index should be between 19-25. Your Body mass index is 28.79 kg/m. If this is out of the aformentioned range listed, please consider follow up with your Primary Care Provider.   ________________________________________________________  The [facility] providers would like to encourage you to use [facility] to communicate with providers for non-urgent requests or questions.  Due to long hold times on the telephone, sending your provider a message by [facility] may be a faster and more efficient way to get a response.  Please allow 48 business hours for a response.  Please remember that this is for non-urgent requests.  _______________________________________________________  Your provider has requested that you go to the basement level for lab work before leaving today. Press "B" on the elevator. The lab is located at the first door on the left as you exit the elevator.  You have been scheduled for an appointment with Dr. Chales Abrahams on 08-17-23 at 930am . Please arrive 10 minutes early for your appointment.  Thank you,  Dr. Lynann Bologna

## 2023-05-19 NOTE — Progress Notes (Signed)
Chief Complaint: To get established.  Referring Provider:  Doreene Eland, MD      ASSESSMENT AND PLAN;   #1. Recent Acute Pancreatitis (oct 2024) @ [place]. ?etiology  #2. H/O lap chole with +IOC 2019, followed by ERCP with ES 2019 (Dr Jennye Boroughs)  #3. GERD on prn protonix   #4. S/P kyphoplasty yesterday.  Plan: -CBC, CMP, lipase -Records from [place] if possible. -FU in 3 months. If any problems, MRCP. -Rpt colon 2027 for CRC screening.   HPI:    Nicole Price is a 73 y.o. female  With hypertension, hyperlipidemia, reactive airway disease with asthma    S/P thoracolumbar kyphoplasty by Dr. Yevette Edwards yesterday  Discussed the use of AI scribe software for clinical note transcription with the patient, who gave verbal consent to proceed.  History of Present Illness   Nicole Price is a 73 year old female with a history of pancreatitis who presents for follow-up after a recent hospitalization for pancreatitis. She was for evaluation to ensure her well-being after the episode.  She traveled to [place] in late October to early November of last year. Shortly after arriving, she became acutely ill and was hospitalized for six days, during which she was diagnosed with pancreatitis. A CT scan was performed due to initial concerns of a cardiac issue or aneurysm, but it confirmed pancreatitis. She was unable to eat for the first couple of days and gradually resumed eating small amounts.   She recalls a previous episode of pancreatitis following gallbladder removal in 2019, which was complicated by a small stone in the bile duct and subsequent ERCP with biliary sphincterotomy complicated by transient atrial fibrillation. The cause of the pancreatitis during the recent hospitalization was not determined. No alcohol use, smoking, or drug use.  No current symptoms of nausea, vomiting, abdominal pain, diarrhea, or constipation, although she occasionally uses Miralax for bowel  movements. For reflux, she takes Protonix as needed, experiencing heartburn only occasionally. She has been sleeping sitting up since mid-December due to back pain but was able to sleep in bed the previous night.     No jaundice dark urine or pale stools.  She has history of chronic constipation-better with MiraLAX 17 g p.o. daily.   Results   RADIOLOGY CT scan: Pancreatitis, no aneurysm (01/2023) per pt  DIAGNOSTIC Colonoscopy: ([facility]) Few diverticula, small hemorrhoids (06/2015) Rpt 10 yrs. earlier if any problems.  Report sent for scanning.         Past Medical History:  Diagnosis Date   Acute pancreatitis    Aortic aneurysm (HCC)    family hx   Asthma    Benign essential hypertension    Colon polyps    GERD (gastroesophageal reflux disease)    Hypercholesteremia    Lumbar compression fracture (HCC)    x4   Osteoporosis    PONV (postoperative nausea and vomiting)     Past Surgical History:  Procedure Laterality Date   BACK SURGERY  2004   x2   CATARACT EXTRACTION, BILATERAL  2019   CHOLECYSTECTOMY  2019   COLONOSCOPY  06/06/2015   Mild sigmoid diverticulosis. Small internal and external hemorrhoids.   EYE SURGERY Right 2023   vitrectomy   JOINT REPLACEMENT Right 2021   Total hip replacement   KYPHOPLASTY  05/18/2023   PARTIAL HYSTERECTOMY  2016   TUBAL LIGATION  1977    Family History  Problem Relation Age of Onset   Heart attack Father    Arthritis  Sister    Asthma Brother    Heart attack Son    Diabetes Son    Emphysema Son     Social History   Tobacco Use   Smoking status: Never   Smokeless tobacco: Never  Vaping Use   Vaping status: Never Used  Substance Use Topics   Alcohol use: Never   Drug use: Never    Current Outpatient Medications  Medication Sig Dispense Refill   albuterol (PROVENTIL) (2.5 MG/3ML) 0.083% nebulizer solution Take 2.5 mg by nebulization every 6 (six) hours as needed for wheezing or shortness of breath.     aspirin  81 MG EC tablet Take 81 mg by mouth daily.     bisoprolol (ZEBETA) 5 MG tablet Take 0.5 tablets (2.5 mg total) by mouth daily.     Cholecalciferol (VITAMIN D) 50 MCG (2000 UT) CAPS Take 2,000 Units by mouth daily.     HYDROcodone-acetaminophen (NORCO/VICODIN) 5-325 MG tablet Take 1 tablet by mouth every 6 (six) hours as needed for moderate pain (pain score 4-6) or severe pain (pain score 7-10). 20 tablet 0   methocarbamol (ROBAXIN) 750 MG tablet Take 1 tablet (750 mg total) by mouth every 6 (six) hours as needed for muscle spasms. 60 tablet 0   montelukast (SINGULAIR) 10 MG tablet Take 10 mg by mouth daily.     pantoprazole (PROTONIX) 40 MG tablet Take 40 mg by mouth daily as needed (acid reflux).     simvastatin (ZOCOR) 20 MG tablet Take 20 mg by mouth daily.     VENTOLIN HFA 108 (90 Base) MCG/ACT inhaler Inhale 1-2 puffs into the lungs every 6 (six) hours as needed for wheezing or shortness of breath.     [Paused] Flaxseed Oil (LINSEED OIL) OIL Take 1,000 mg by mouth daily. (Patient not taking: Reported on 05/19/2023)     No current facility-administered medications for this visit.    Allergies  Allergen Reactions   Nickel Itching and Swelling    Itchy and makes lips swell    Review of Systems:  Constitutional: Denies fever, chills, diaphoresis, appetite change and fatigue.  HEENT: Denies photophobia, eye pain, redness, hearing loss, ear pain, congestion, sore throat, rhinorrhea, sneezing, mouth sores, neck pain, neck stiffness and tinnitus.   Respiratory: Denies SOB, DOE, cough, chest tightness,  and wheezing.   Cardiovascular: Denies chest pain, palpitations and leg swelling.  Genitourinary: Denies dysuria, urgency, frequency, hematuria, flank pain and difficulty urinating.  Musculoskeletal: Denies myalgias, back pain, joint swelling, arthralgias and gait problem.  S/p kyphoplasty yesterday. Skin: No rash.  Neurological: Denies dizziness, seizures, syncope, weakness, light-headedness,  numbness and headaches.  Hematological: Denies adenopathy. Easy bruising, personal or family bleeding history  Psychiatric/Behavioral: No anxiety or depression     Physical Exam:    BP 110/68   Pulse 61   Ht 5\' 3"  (1.6 m)   Wt 162 lb 8 oz (73.7 kg)   SpO2 97%   BMI 28.79 kg/m  Wt Readings from Last 3 Encounters:  05/19/23 162 lb 8 oz (73.7 kg)  05/18/23 162 lb (73.5 kg)  05/13/23 162 lb 3.2 oz (73.6 kg)   Constitutional:  Well-developed, in no acute distress. In back brace Psychiatric: Normal mood and affect. Behavior is normal. HEENT: Pupils normal.  Conjunctivae are normal. No scleral icterus. Cardiovascular: Normal rate, regular rhythm. No edema Pulmonary/chest: Effort normal and breath sounds normal. No wheezing, rales or rhonchi. Abdominal: Soft, nondistended. Nontender. Bowel sounds active throughout. There are no masses palpable. No  hepatomegaly. Neurological: Alert and oriented to person place and time. Skin: Skin is warm and dry. No rashes noted.  Data Reviewed: I have personally reviewed following labs and imaging studies  CBC:    Latest Ref Rng & Units 05/13/2023    9:30 AM 09/17/2010    4:45 PM 12/29/2006    3:45 AM  CBC  WBC 4.0 - 10.5 K/uL 8.6  14.3  9.1   Hemoglobin 12.0 - 15.0 g/dL 62.1  30.8  65.7   Hematocrit 36.0 - 46.0 % 47.2  47.2  41.0   Platelets 150 - 400 K/uL 239  223  209     CMP:    Latest Ref Rng & Units 05/13/2023    9:30 AM 09/17/2010    4:45 PM 12/29/2006    3:45 AM  CMP  Glucose 70 - 99 mg/dL 93  95  86   BUN 8 - 23 mg/dL 14  14  10    Creatinine 0.44 - 1.00 mg/dL 8.46  9.62  9.52   Sodium 135 - 145 mmol/L 140  140  138   Potassium 3.5 - 5.1 mmol/L 4.0  3.9  3.8   Chloride 98 - 111 mmol/L 109  106  110   CO2 22 - 32 mmol/L 23  19  24    Calcium 8.9 - 10.3 mg/dL 9.9  9.9  9.0   Total Protein 6.0 - 8.3 g/dL  7.4    Total Bilirubin 0.3 - 1.2 mg/dL  1.3    Alkaline Phos 39 - 117 U/L  77    AST 0 - 37 U/L  32    ALT 0 - 35 U/L  36         Recent Results (from the past 240 hours)  Surgical pcr screen     Status: Abnormal   Collection Time: 05/13/23  8:49 AM   Specimen: Nasal Mucosa; Nasal Swab  Result Value Ref Range Status   MRSA, PCR NEGATIVE NEGATIVE Final   Staphylococcus aureus POSITIVE (A) NEGATIVE Final    Comment: (NOTE) The Xpert SA Assay (FDA approved for NASAL specimens in patients 19 years of age and older), is one component of a comprehensive surveillance program. It is not intended to diagnose infection nor to guide or monitor treatment. Performed at [facility], [address] [place], [place] [address]       Radiology Studies: DG THORACOLUMBAR SPINE Result Date: 05/18/2023 CLINICAL DATA:  Elective surgery.  T11, T12, and L1 kyphoplasty. EXAM: THORACOLUMBAR SPINE 1V COMPARISON:  Preoperative imaging. FINDINGS: Three fluoroscopic spot views of the thoracolumbar spine obtained in the operating room. 3 contiguous level kyphoplasty, presumably T11, T12, and L1. Fluoroscopy time 234.1 seconds. Dose 89.43 mGy. IMPRESSION: Procedural fluoroscopy during kyphoplasty. Electronically Signed   By: Narda Rutherford M.D.   On: 05/18/2023 16:02   DG C-Arm 1-60 Min-No Report Result Date: 05/18/2023 Fluoroscopy was utilized by the requesting physician.  No radiographic interpretation.      Edman Circle, MD 05/19/2023, 10:35 AM  Cc: Doreene Eland, MD

## 2023-05-19 NOTE — Anesthesia Postprocedure Evaluation (Signed)
Anesthesia Post Note  Patient: Nicole Price  Procedure(s) Performed: THORACIC ELEVEN, THORACIC TWELVE, LUMBAR ONE KYPHOPLASTY (Spine Lumbar)     Patient location during evaluation: PACU Anesthesia Type: General Level of consciousness: awake and alert Pain management: pain level controlled Vital Signs Assessment: post-procedure vital signs reviewed and stable Respiratory status: spontaneous breathing, nonlabored ventilation and respiratory function stable Cardiovascular status: blood pressure returned to baseline and stable Postop Assessment: no apparent nausea or vomiting Anesthetic complications: no   No notable events documented.  Last Vitals:  Vitals:   05/18/23 1515 05/18/23 1530  BP: 122/70 121/74  Pulse: (!) 58 (!) 59  Resp: 18 16  Temp:  36.5 C  SpO2: 97% 94%    Last Pain:  Vitals:   05/18/23 0949  PainSc: 0-No pain                 Lowella Curb

## 2023-08-17 ENCOUNTER — Other Ambulatory Visit (INDEPENDENT_AMBULATORY_CARE_PROVIDER_SITE_OTHER)

## 2023-08-17 ENCOUNTER — Encounter: Payer: Self-pay | Admitting: Gastroenterology

## 2023-08-17 ENCOUNTER — Ambulatory Visit: Payer: Medicare Other | Admitting: Gastroenterology

## 2023-08-17 VITALS — BP 108/72 | HR 56 | Ht 63.0 in | Wt 165.2 lb

## 2023-08-17 DIAGNOSIS — K859 Acute pancreatitis without necrosis or infection, unspecified: Secondary | ICD-10-CM

## 2023-08-17 DIAGNOSIS — K219 Gastro-esophageal reflux disease without esophagitis: Secondary | ICD-10-CM

## 2023-08-17 DIAGNOSIS — Z9049 Acquired absence of other specified parts of digestive tract: Secondary | ICD-10-CM

## 2023-08-17 LAB — COMPREHENSIVE METABOLIC PANEL WITH GFR
ALT: 15 U/L (ref 0–35)
AST: 17 U/L (ref 0–37)
Albumin: 4.3 g/dL (ref 3.5–5.2)
Alkaline Phosphatase: 73 U/L (ref 39–117)
BUN: 19 mg/dL (ref 6–23)
CO2: 25 meq/L (ref 19–32)
Calcium: 9.6 mg/dL (ref 8.4–10.5)
Chloride: 109 meq/L (ref 96–112)
Creatinine, Ser: 0.9 mg/dL (ref 0.40–1.20)
GFR: 63.07 mL/min (ref >60.00)
Glucose, Bld: 91 mg/dL (ref 70–99)
Potassium: 4.2 meq/L (ref 3.5–5.1)
Sodium: 140 meq/L (ref 135–145)
Total Bilirubin: 0.7 mg/dL (ref 0.2–1.2)
Total Protein: 6.8 g/dL (ref 6.0–8.3)

## 2023-08-17 LAB — CBC
HCT: 45.8 % (ref 36.0–46.0)
Hemoglobin: 15.2 g/dL — ABNORMAL HIGH (ref 12.0–15.0)
MCHC: 33.2 g/dL (ref 30.0–36.0)
MCV: 91.5 fl (ref 78.0–100.0)
Platelets: 224 10*3/uL (ref 150.0–400.0)
RBC: 5 Mil/uL (ref 3.87–5.11)
RDW: 14.2 % (ref 11.5–15.5)
WBC: 8.3 10*3/uL (ref 4.0–10.5)

## 2023-08-17 LAB — LIPASE: Lipase: 25 U/L (ref 11.0–59.0)

## 2023-08-17 NOTE — Progress Notes (Signed)
 Chief Complaint: FU  Referring Provider:  [facility]      ASSESSMENT AND PLAN;   #1. Recent Acute Pancreatitis (oct 2024) @ [place] . ?etiology- Resolved  #2. H/O lap chole with +IOC 2019, followed by ERCP with ES 2019 (Dr Monico Anna)  #3. GERD on prn protonix.  Plan: -CBC, CMP, lipase -Rpt colon 2027 for CRC screening. Age may be prohibitive -If any problems, then CT followed by MRCP. She wants to hold off as she is doing great -Call us  if any problems   HPI:    Nicole Price is a 73 y.o. female  With hypertension, hyperlipidemia, reactive airway disease with asthma    History of Present Illness Nicole Price is a 72 year old female with a history of pancreatitis and cholecystectomy who presents for follow-up   Doing very well.  She has a history of pancreatitis that occurred in late October to November while in [place] , during which she was hospitalized and experienced significant illness. She recalls having her gallbladder removed and subsequently developing pancreatitis, which was thought to be caused by a CBD stone requiring ERCP with sphincterotomy   No alcohol.  No jaundice dark urine or pale stools.  No significant GI complaints.   She experiences occasional heartburn, particularly after eating certain foods, and manages this with Protonix as needed. No issues with swallowing, nausea, or vomiting.  She occasionally experiences constipation and uses a stool softener as needed, which she finds effective. She reports having had a colonoscopy in 2017, with the next one recommended in 2027.  Wt Readings from Last 3 Encounters:  08/17/23 165 lb 4 oz (75 kg)  05/19/23 162 lb 8 oz (73.7 kg)  05/18/23 162 lb (73.5 kg)      Results   RADIOLOGY CT scan: Pancreatitis, no aneurysm (01/2023) per pt  DIAGNOSTIC Colonoscopy: ([facility]) Few diverticula, small hemorrhoids (06/2015) Rpt 10 yrs. earlier if any problems.  Report sent for scanning.         Past  Medical History:  Diagnosis Date   Acute pancreatitis    Aortic aneurysm (HCC)    family hx   Asthma    Benign essential hypertension    Colon polyps    GERD (gastroesophageal reflux disease)    Hypercholesteremia    Lumbar compression fracture (HCC)    x4   Osteoporosis    PONV (postoperative nausea and vomiting)     Past Surgical History:  Procedure Laterality Date   BACK SURGERY  2004   x2   CATARACT EXTRACTION, BILATERAL  2019   CHOLECYSTECTOMY  2019   COLONOSCOPY  06/06/2015   Mild sigmoid diverticulosis. Small internal and external hemorrhoids.   EYE SURGERY Right 2023   vitrectomy   JOINT REPLACEMENT Right 2021   Total hip replacement   KYPHOPLASTY  05/18/2023   KYPHOPLASTY N/A 05/18/2023   Procedure: THORACIC ELEVEN, THORACIC TWELVE, LUMBAR ONE KYPHOPLASTY;  Surgeon: Virl Grimes, MD;  Location: [facility] OR;  Service: Orthopedics;  Laterality: N/A;   PARTIAL HYSTERECTOMY  2016   TUBAL LIGATION  1977    Family History  Problem Relation Age of Onset   Heart attack Father    Arthritis Sister    Asthma Brother    Heart attack Son    Diabetes Son    Emphysema Son     Social History   Tobacco Use   Smoking status: Never   Smokeless tobacco: Never  Vaping Use   Vaping status: Never Used  Substance  Use Topics   Alcohol use: Never   Drug use: Never    Current Outpatient Medications  Medication Sig Dispense Refill   albuterol  (PROVENTIL ) (2.5 MG/3ML) 0.083% nebulizer solution Take 2.5 mg by nebulization every 6 (six) hours as needed for wheezing or shortness of breath.     aspirin 81 MG EC tablet Take 81 mg by mouth daily.     bisoprolol  (ZEBETA ) 5 MG tablet Take 0.5 tablets (2.5 mg total) by mouth daily.     Cholecalciferol (VITAMIN D) 50 MCG (2000 UT) CAPS Take 2,000 Units by mouth daily.     Flaxseed Oil (LINSEED OIL) OIL Take 1,000 mg by mouth daily.     montelukast (SINGULAIR) 10 MG tablet Take 10 mg by mouth daily.     pantoprazole (PROTONIX) 40 MG  tablet Take 40 mg by mouth daily as needed (acid reflux).     simvastatin (ZOCOR) 20 MG tablet Take 20 mg by mouth daily.     VENTOLIN  HFA 108 (90 Base) MCG/ACT inhaler Inhale 1-2 puffs into the lungs every 6 (six) hours as needed for wheezing or shortness of breath.     No current facility-administered medications for this visit.    Allergies  Allergen Reactions   Nickel Itching and Swelling    Itchy and makes lips swell    Review of Systems:  neg     Physical Exam:    BP 108/72   Pulse (!) 56   Ht 5\' 3"  (1.6 m)   Wt 165 lb 4 oz (75 kg)   SpO2 96%   BMI 29.27 kg/m  Wt Readings from Last 3 Encounters:  08/17/23 165 lb 4 oz (75 kg)  05/19/23 162 lb 8 oz (73.7 kg)  05/18/23 162 lb (73.5 kg)   Constitutional:  Well-developed, in no acute distress. In back brace Psychiatric: Normal mood and affect. Behavior is normal. HEENT: Pupils normal.  Conjunctivae are normal. No scleral icterus. Cardiovascular: Normal rate, regular rhythm. No edema Pulmonary/chest: Effort normal and breath sounds normal. No wheezing, rales or rhonchi. Abdominal: Soft, nondistended. Nontender. Bowel sounds active throughout. There are no masses palpable. No hepatomegaly. Neurological: Alert and oriented to person place and time. Skin: Skin is warm and dry. No rashes noted.  Data Reviewed: I have personally reviewed following labs and imaging studies  CBC:    Latest Ref Rng & Units 05/19/2023   11:06 AM 05/13/2023    9:30 AM 09/17/2010    4:45 PM  CBC  WBC 4.0 - 10.5 K/uL 15.0  8.6  14.3   Hemoglobin 12.0 - 15.0 g/dL 82.9  56.2  13.0   Hematocrit 36.0 - 46.0 % 44.6  47.2  47.2   Platelets 150.0 - 400.0 K/uL 214.0  239  223     CMP:    Latest Ref Rng & Units 05/19/2023   11:06 AM 05/13/2023    9:30 AM 09/17/2010    4:45 PM  CMP  Glucose 70 - 99 mg/dL 865  93  95   BUN 6 - 23 mg/dL 17  14  14    Creatinine 0.40 - 1.20 mg/dL 7.84  6.96  2.95   Sodium 135 - 145 mEq/L 140  140  140   Potassium 3.5  - 5.1 mEq/L 4.2  4.0  3.9   Chloride 96 - 112 mEq/L 106  109  106   CO2 19 - 32 mEq/L 24  23  19    Calcium 8.4 - 10.5 mg/dL 28.4  9.9  9.9   Total Protein 6.0 - 8.3 g/dL 7.3   7.4   Total Bilirubin 0.2 - 1.2 mg/dL 1.0   1.3   Alkaline Phos 39 - 117 U/L 94   77   AST 0 - 37 U/L 19   32   ALT 0 - 35 U/L 17   36          Magnus Schuller, MD 08/17/2023, 9:16 AM  Cc: [facility].

## 2023-08-17 NOTE — Patient Instructions (Signed)
 _______________________________________________________  If your blood pressure at your visit was 140/90 or greater, please contact your primary care physician to follow up on this. _______________________________________________________  If you are age 74 or older, your body mass index should be between 23-30. Your Body mass index is 29.27 kg/m. If this is out of the aforementioned range listed, please consider follow up with your Primary Care Provider. ________________________________________________________  The [facility] providers would like to encourage you to use MYCHART to communicate with providers for non-urgent requests or questions.  Due to long hold times on the telephone, sending your provider a message by [facility] may be a faster and more efficient way to get a response.  Please allow 48 business hours for a response.  Please remember that this is for non-urgent requests.  _______________________________________________________  Your provider has requested that you go to the basement level for lab work before leaving today. Press "B" on the elevator. The lab is located at the first door on the left as you exit the elevator.  Due to recent changes in healthcare laws, you may see the results of your imaging and laboratory studies on MyChart before your provider has had a chance to review them.  We understand that in some cases there may be results that are confusing or concerning to you. Not all laboratory results come back in the same time frame and the provider may be waiting for multiple results in order to interpret others.  Please give us  48 hours in order for your provider to thoroughly review all the results before contacting the office for clarification of your results.   Thank you for entrusting me with your care and choosing [facility].  Dr Venice Gillis

## 2023-08-18 ENCOUNTER — Ambulatory Visit: Payer: Self-pay | Admitting: Gastroenterology

## 2024-05-05 ENCOUNTER — Emergency Department (HOSPITAL_COMMUNITY)
Admission: EM | Admit: 2024-05-05 | Discharge: 2024-05-05 | Disposition: A | Discharge status: Home / Self Care | Attending: Emergency Medicine | Admitting: Emergency Medicine

## 2024-05-05 ENCOUNTER — Emergency Department (HOSPITAL_COMMUNITY)

## 2024-05-05 ENCOUNTER — Encounter (HOSPITAL_COMMUNITY): Payer: Self-pay

## 2024-05-05 ENCOUNTER — Other Ambulatory Visit: Payer: Self-pay

## 2024-05-05 DIAGNOSIS — S060X1A Concussion with loss of consciousness of 30 minutes or less, initial encounter: Secondary | ICD-10-CM | POA: Insufficient documentation

## 2024-05-05 DIAGNOSIS — I700 Atherosclerosis of aorta: Secondary | ICD-10-CM | POA: Insufficient documentation

## 2024-05-05 DIAGNOSIS — D1771 Benign lipomatous neoplasm of kidney: Secondary | ICD-10-CM | POA: Insufficient documentation

## 2024-05-05 DIAGNOSIS — R739 Hyperglycemia, unspecified: Secondary | ICD-10-CM | POA: Insufficient documentation

## 2024-05-05 DIAGNOSIS — R4182 Altered mental status, unspecified: Secondary | ICD-10-CM | POA: Insufficient documentation

## 2024-05-05 DIAGNOSIS — K449 Diaphragmatic hernia without obstruction or gangrene: Secondary | ICD-10-CM | POA: Insufficient documentation

## 2024-05-05 DIAGNOSIS — W1830XA Fall on same level, unspecified, initial encounter: Secondary | ICD-10-CM | POA: Insufficient documentation

## 2024-05-05 DIAGNOSIS — M4854XA Collapsed vertebra, not elsewhere classified, thoracic region, initial encounter for fracture: Secondary | ICD-10-CM | POA: Insufficient documentation

## 2024-05-05 DIAGNOSIS — Z7982 Long term (current) use of aspirin: Secondary | ICD-10-CM | POA: Insufficient documentation

## 2024-05-05 DIAGNOSIS — R911 Solitary pulmonary nodule: Secondary | ICD-10-CM | POA: Insufficient documentation

## 2024-05-05 LAB — COMPREHENSIVE METABOLIC PANEL WITH GFR
ALT: 29 U/L (ref 0–44)
AST: 32 U/L (ref 15–41)
Albumin: 4.3 g/dL (ref 3.5–5.0)
Alkaline Phosphatase: 68 U/L (ref 38–126)
Anion gap: 15 (ref 5–15)
BUN: 18 mg/dL (ref 8–23)
CO2: 17 mmol/L — ABNORMAL LOW (ref 22–32)
Calcium: 10 mg/dL (ref 8.9–10.3)
Chloride: 105 mmol/L (ref 98–111)
Creatinine, Ser: 1.16 mg/dL — ABNORMAL HIGH (ref 0.44–1.00)
GFR, Estimated: 49 mL/min — ABNORMAL LOW
Glucose, Bld: 104 mg/dL — ABNORMAL HIGH (ref 70–99)
Potassium: 3.4 mmol/L — ABNORMAL LOW (ref 3.5–5.1)
Sodium: 138 mmol/L (ref 135–145)
Total Bilirubin: 1.1 mg/dL (ref 0.0–1.2)
Total Protein: 6.9 g/dL (ref 6.5–8.1)

## 2024-05-05 LAB — CBC WITH DIFFERENTIAL/PLATELET
Abs Immature Granulocytes: 0.11 10*3/uL — ABNORMAL HIGH (ref 0.00–0.07)
Basophils Absolute: 0.1 10*3/uL (ref 0.0–0.1)
Basophils Relative: 1 %
Eosinophils Absolute: 0.1 10*3/uL (ref 0.0–0.5)
Eosinophils Relative: 1 %
HCT: 44.2 % (ref 36.0–46.0)
Hemoglobin: 15.1 g/dL — ABNORMAL HIGH (ref 12.0–15.0)
Immature Granulocytes: 1 %
Lymphocytes Relative: 17 %
Lymphs Abs: 1.7 10*3/uL (ref 0.7–4.0)
MCH: 30.7 pg (ref 26.0–34.0)
MCHC: 34.2 g/dL (ref 30.0–36.0)
MCV: 89.8 fL (ref 80.0–100.0)
Monocytes Absolute: 0.7 10*3/uL (ref 0.1–1.0)
Monocytes Relative: 7 %
Neutro Abs: 7.7 10*3/uL (ref 1.7–7.7)
Neutrophils Relative %: 73 %
Platelets: 202 10*3/uL (ref 150–400)
RBC: 4.92 MIL/uL (ref 3.87–5.11)
RDW: 13.2 % (ref 11.5–15.5)
WBC: 10.4 10*3/uL (ref 4.0–10.5)
nRBC: 0 % (ref 0.0–0.2)

## 2024-05-05 LAB — I-STAT CHEM 8, ED
BUN: 19 mg/dL (ref 8–23)
Calcium, Ion: 1.1 mmol/L — ABNORMAL LOW (ref 1.15–1.40)
Chloride: 109 mmol/L (ref 98–111)
Creatinine, Ser: 1.2 mg/dL — ABNORMAL HIGH (ref 0.44–1.00)
Glucose, Bld: 97 mg/dL (ref 70–99)
HCT: 43 % (ref 36.0–46.0)
Hemoglobin: 14.6 g/dL (ref 12.0–15.0)
Potassium: 3.3 mmol/L — ABNORMAL LOW (ref 3.5–5.1)
Sodium: 140 mmol/L (ref 135–145)
TCO2: 19 mmol/L — ABNORMAL LOW (ref 22–32)

## 2024-05-05 LAB — CBG MONITORING, ED: Glucose-Capillary: 102 mg/dL — ABNORMAL HIGH (ref 70–99)

## 2024-05-05 MED ORDER — IOHEXOL 350 MG/ML SOLN
75.0000 mL | Freq: Once | INTRAVENOUS | Status: AC | PRN
  Administered 2024-05-05: 75 mL via INTRAVENOUS

## 2024-05-05 NOTE — ED Notes (Signed)
 Patient transported to CT with second RN.

## 2024-05-05 NOTE — ED Notes (Signed)
 Pt assisted on bedpan by NT. Pt unable to hold off prior to CT.

## 2024-05-05 NOTE — ED Notes (Signed)
 Emil DO notified of patient condition. Level 2 trauma to be activated.

## 2024-05-05 NOTE — Discharge Instructions (Signed)
 Follow-up with your family doctor.  Please return for sudden worsening headache confusion or vomiting.

## 2024-05-05 NOTE — ED Notes (Addendum)
 EDP notified of patient refusal of CT imaging by CT tech until labs resulted. Awaiting new orders.

## 2024-05-05 NOTE — ED Notes (Signed)
 Pt ambulated into hallway with minimal assistance. Returned to bed.

## 2024-05-05 NOTE — ED Notes (Signed)
 ED Provider at bedside.

## 2024-05-06 NOTE — ED Triage Notes (Signed)
 Pt bib [facility] coming from home after patient had unwitnessed fall outside. Pt was reportedly taking some trash out and came back inside confused. Pt A&Ox2 during triage. Pt oriented to self and location disoriented to time and situation with repetitive questioning. Pt not on blood thinners. Pt C/O headache.
